# Patient Record
Sex: Female | Born: 1945 | Race: White | Hispanic: No | Marital: Married | Smoking: Never smoker
Health system: Southern US, Community
[De-identification: ages and names within clinical notes are randomized; demographics above are authoritative.]

## PROBLEM LIST (undated history)

## (undated) DIAGNOSIS — I5189 Other ill-defined heart diseases: Secondary | ICD-10-CM

## (undated) DIAGNOSIS — N393 Stress incontinence (female) (male): Secondary | ICD-10-CM

## (undated) DIAGNOSIS — I428 Other cardiomyopathies: Secondary | ICD-10-CM

## (undated) DIAGNOSIS — M1990 Unspecified osteoarthritis, unspecified site: Secondary | ICD-10-CM

## (undated) DIAGNOSIS — E785 Hyperlipidemia, unspecified: Secondary | ICD-10-CM

## (undated) DIAGNOSIS — I059 Rheumatic mitral valve disease, unspecified: Secondary | ICD-10-CM

## (undated) DIAGNOSIS — I341 Nonrheumatic mitral (valve) prolapse: Secondary | ICD-10-CM

## (undated) DIAGNOSIS — R609 Edema, unspecified: Secondary | ICD-10-CM

## (undated) HISTORY — PX: OVARIAN CYST REMOVAL: SHX89

## (undated) HISTORY — DX: Hyperlipidemia, unspecified: E78.5

## (undated) HISTORY — DX: Rheumatic mitral valve disease, unspecified: I05.9

## (undated) HISTORY — PX: ABDOMINAL HYSTERECTOMY: SHX81

## (undated) HISTORY — DX: Edema, unspecified: R60.9

## (undated) HISTORY — DX: Nonrheumatic mitral (valve) prolapse: I34.1

## (undated) HISTORY — DX: Other ill-defined heart diseases: I51.89

## (undated) HISTORY — DX: Other cardiomyopathies: I42.8

## (undated) HISTORY — DX: Unspecified osteoarthritis, unspecified site: M19.90

## (undated) HISTORY — DX: Stress incontinence (female) (male): N39.3

## (undated) HISTORY — PX: CARDIAC CATHETERIZATION: SHX172

---

## 2005-02-18 ENCOUNTER — Ambulatory Visit: Payer: Self-pay | Admitting: Cardiology

## 2005-02-18 ENCOUNTER — Ambulatory Visit: Payer: Self-pay | Admitting: Internal Medicine

## 2005-02-25 ENCOUNTER — Ambulatory Visit: Payer: Self-pay

## 2005-03-17 ENCOUNTER — Ambulatory Visit: Payer: Self-pay | Admitting: Internal Medicine

## 2005-04-15 ENCOUNTER — Ambulatory Visit: Payer: Self-pay | Admitting: Internal Medicine

## 2010-02-25 ENCOUNTER — Encounter: Payer: Self-pay | Admitting: Cardiovascular Disease

## 2012-01-16 ENCOUNTER — Encounter: Payer: Self-pay | Admitting: Cardiovascular Disease

## 2012-01-16 ENCOUNTER — Ambulatory Visit (INDEPENDENT_AMBULATORY_CARE_PROVIDER_SITE_OTHER): Payer: Medicare Other | Admitting: Cardiovascular Disease

## 2012-01-16 VITALS — BP 110/71 | HR 87 | Ht 66.0 in | Wt 192.0 lb

## 2012-01-16 DIAGNOSIS — I429 Cardiomyopathy, unspecified: Secondary | ICD-10-CM | POA: Insufficient documentation

## 2012-01-16 DIAGNOSIS — R002 Palpitations: Secondary | ICD-10-CM | POA: Insufficient documentation

## 2012-01-16 NOTE — Assessment & Plan Note (Signed)
Non-ischemic. Will repeat echo.

## 2012-01-16 NOTE — Assessment & Plan Note (Signed)
Most likely premature beats. I will only arrange a monitor if she has recurrence of palpitations. She will let us know if she has recurrence.

## 2012-01-16 NOTE — Progress Notes (Signed)
History of Present Illness: 66 yo WF with history of palpitations, mitral valve prolapse, mild MR, HLD who is here today to establish cardiology care. She tells me that has had some upper back pain. This occurs at rest and with exertion. No chest pain. She has noticed some pain in her neck as well. This is over her left shoulder and left neck. She was awakened last week with her heart racing and fluttering. This lasted for several minutes. No recurrence. Normal cardiac cath at [facility]. Echo 10/10 with LVEF 50%. Septal hypertrophy, mild. Mild MR. Moderate LAE. Myoview 10/09 with LVEF 42%  Primary Care Physician:   Last Lipid Profile:  Past Medical History  Diagnosis Date  . Arrhythmia     HEART FLUTTER  . Dizziness   . Mitral valve problem     BY HISTORY  . Mitral regurgitation   . 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 Connective tissue disease     unspecified  . Stress incontinence   . Dizziness   . Hyperlipidemia     mixed  . SOB (shortness of breath)     Past Surgical History  Procedure Date  . Cardiac catheterization     1998 [facility] "CAD"    Current Outpatient Prescriptions  Medication Sig Dispense Refill  . aspirin 81 MG tablet Take 81 mg by mouth daily.      . clindamycin (CLEOCIN) 300 MG capsule Take 300 mg by mouth 3 (three) times daily. PRIOR TO DENTAL SURGERY      . diclofenac sodium (VOLTAREN) 1 % GEL As directed      . diphenhydrAMINE (SOMINEX) 25 MG tablet Benadryl when necessary      . LORazepam (ATIVAN) 0.5 MG tablet Take 0.5 mg by mouth every 8 (eight) hours.      . Multiple Vitamin (MULTIVITAMIN) tablet Take 1 tablet by mouth daily.      . NON FORMULARY Nametasone furoate cream 1 %  As needed for dry patches on skin        Allergies  Allergen Reactions  . Atorvastatin   . Carbocaine (Mepivacaine Hcl)     Anaphylaxis- pt stopped breathing  . Epinephrine   . Mepivacaine   . Penicillins   . Pravastatin     Extreme pain in joints  . Procaine   .  Sulfa Antibiotics     History   Social History  . Marital Status: Married    Spouse Name: N/A    Number of Children: 3  . Years of Education: N/A   Occupational History  . contractor    Social History Main Topics  . Smoking status: Never Smoker   . Smokeless tobacco: Not on file  . Alcohol Use: Not on file  . Drug Use: Not on file  . Sexually Active: Not on file   Other Topics Concern  . Not on file   Social History Narrative  . No narrative on file    Family History  Problem Relation Age of Onset  . Coronary artery disease Other   . Obesity Other   . Hypertension Other   . Heart failure Father     Review of Systems:  As stated in the HPI and otherwise negative.   BP 110/71  Pulse 87  Ht 5\' 6"  (1.676 m)  Wt 192 lb (87.091 kg)  BMI 30.99 kg/m2  Physical Examination: General: Well developed, well nourished, NAD HEENT: OP clear, mucus membranes moist SKIN: warm,  dry. No rashes. Neuro: No focal deficits Musculoskeletal: Muscle strength 5/5 all ext Psychiatric: Mood and affect normal Neck: No JVD, no carotid bruits, no thyromegaly, no lymphadenopathy. Lungs:Clear bilaterally, no wheezes, rhonci, crackles Cardiovascular: Regular rate and rhythm. No murmurs, gallops or rubs. Abdomen:Soft. Bowel sounds present. Non-tender.  Extremities: No lower extremity edema. Pulses are 2 + in the bilateral DP/PT.  EKG:  NSR, rate 87 bpm. Non-specific T wave changes.

## 2012-01-16 NOTE — Patient Instructions (Signed)

## 2012-01-22 ENCOUNTER — Telehealth: Payer: Self-pay | Admitting: *Deleted

## 2012-01-22 ENCOUNTER — Ambulatory Visit (HOSPITAL_COMMUNITY): Payer: Medicare Other | Attending: Cardiology

## 2012-01-22 ENCOUNTER — Other Ambulatory Visit: Payer: Self-pay

## 2012-01-22 DIAGNOSIS — I428 Other cardiomyopathies: Secondary | ICD-10-CM | POA: Insufficient documentation

## 2012-01-22 DIAGNOSIS — I059 Rheumatic mitral valve disease, unspecified: Secondary | ICD-10-CM | POA: Insufficient documentation

## 2012-01-22 DIAGNOSIS — I079 Rheumatic tricuspid valve disease, unspecified: Secondary | ICD-10-CM | POA: Insufficient documentation

## 2012-01-22 DIAGNOSIS — I369 Nonrheumatic tricuspid valve disorder, unspecified: Secondary | ICD-10-CM

## 2012-01-22 DIAGNOSIS — I510 Cardiac septal defect, acquired: Secondary | ICD-10-CM | POA: Insufficient documentation

## 2012-01-22 DIAGNOSIS — E785 Hyperlipidemia, unspecified: Secondary | ICD-10-CM | POA: Insufficient documentation

## 2012-01-22 DIAGNOSIS — R0609 Other forms of dyspnea: Secondary | ICD-10-CM | POA: Insufficient documentation

## 2012-01-22 DIAGNOSIS — I429 Cardiomyopathy, unspecified: Secondary | ICD-10-CM

## 2012-01-22 DIAGNOSIS — R0989 Other specified symptoms and signs involving the circulatory and respiratory systems: Secondary | ICD-10-CM | POA: Insufficient documentation

## 2012-01-22 DIAGNOSIS — I519 Heart disease, unspecified: Secondary | ICD-10-CM | POA: Insufficient documentation

## 2012-01-22 DIAGNOSIS — R002 Palpitations: Secondary | ICD-10-CM | POA: Insufficient documentation

## 2012-01-22 NOTE — Telephone Encounter (Signed)
Pt aware of echo results 

## 2015-06-21 ENCOUNTER — Encounter: Payer: Medicare Other | Admitting: Cardiovascular Disease

## 2015-06-28 ENCOUNTER — Encounter: Payer: Medicare Other | Admitting: Cardiovascular Disease

## 2015-09-13 ENCOUNTER — Encounter: Payer: Medicare Other | Admitting: Cardiovascular Disease

## 2016-06-04 ENCOUNTER — Encounter: Payer: Self-pay | Admitting: Cardiovascular Disease

## 2016-06-04 ENCOUNTER — Ambulatory Visit (INDEPENDENT_AMBULATORY_CARE_PROVIDER_SITE_OTHER): Payer: Medicare Other | Admitting: Cardiovascular Disease

## 2016-06-04 VITALS — BP 120/70 | HR 81 | Ht 66.5 in | Wt 207.8 lb

## 2016-06-04 DIAGNOSIS — R0789 Other chest pain: Secondary | ICD-10-CM

## 2016-06-04 DIAGNOSIS — I428 Other cardiomyopathies: Secondary | ICD-10-CM | POA: Diagnosis not present

## 2016-06-04 DIAGNOSIS — E7800 Pure hypercholesterolemia, unspecified: Secondary | ICD-10-CM

## 2016-06-04 NOTE — Progress Notes (Signed)
Chief Complaint  Patient presents with  . Chest Pain    History of Present Illness: 70 yo WF with history of palpitations, mitral valve prolapse, HLD who is here today to re-establish cardiology care. I saw her once in May 2013 as a new patient for evaulation of palpitations and decreased LV systolic function. Normal cardiac cath at [facility]. Echo 10/10 with LVEF 50%. Septal hypertrophy, mild. Mild MR. Moderate LAE. Myoview 10/09 with LVEF 42%. I ordered an echo at her first visit here in 2013. This showed normal LV systolic function with LVEF=60%. No mitral regurgitation. She was told in the past she had mitral valve prolapse. She has not tolerated statins. She has been followed by a cardiologist in [place], [place] and she feels that her legs still hurt because of the statin that he had prescribed. She is not happy with what she was told in that office so she is here today to re-establish in our office.   She tells me today that she has occasional chest pressure. This has been over the last 3 months. This has happened at rest while lying in bed, several times when she is hurrying. This is described as a substernal heaviness. She has had some LE edema. This resolves at night. She has rare palpitations. Overall feeling well.   Primary Care Physician: No PCP Per Patient  Her PCP recently moved.    Past Medical History:  Diagnosis Date  . Hyperlipidemia    mixed  . Mitral valve problem    BY HISTORY  . 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 Stress incontinence     Past Surgical History:  Procedure Laterality Date  . ABDOMINAL HYSTERECTOMY    . CARDIAC CATHETERIZATION     1998 [facility] "CAD"  . OVARIAN CYST REMOVAL      Current Outpatient Prescriptions  Medication Sig Dispense Refill  . aspirin 81 MG tablet Take 81 mg by mouth daily.    . clindamycin (CLEOCIN) 300 MG capsule Take 300 mg by mouth 3 (three) times daily. PRIOR TO DENTAL SURGERY    . diclofenac sodium (VOLTAREN) 1 % GEL  Apply 4 g topically as directed. As directed     . LORazepam (ATIVAN) 0.5 MG tablet Take 0.5 mg by mouth every 8 (eight) hours.    . Multiple Vitamin (MULTIVITAMIN) tablet Take 1 tablet by mouth daily.    . NON FORMULARY Apply 1 application topically 2 (two) times daily as needed. Nametasone furoate cream 1 %  As needed for dry patches on skin      No current facility-administered medications for this visit.     Allergies  Allergen Reactions  . Mepivacaine Anaphylaxis  . Procaine Other (See Comments)    Irregular heartbeat  . Penicillins Other (See Comments)    Weakness, vision problems, dyspnea, sweating  . Sulfa Antibiotics Other (See Comments)    Topical cream applied - made her rash worse where she applied it.  . Atorvastatin   . Carbocaine [Mepivacaine Hcl]     Anaphylaxis- pt stopped breathing  . Epinephrine Other (See Comments)    "extremem rapid heart beat"  . Pravastatin     Extreme pain in joints    Social History   Social History  . Marital status: Married    Spouse name: N/A  . Number of children: 3  . Years of education: N/A   Occupational History  . contractor    Social History Main Topics  .  Smoking status: Never Smoker  . Smokeless tobacco: Not on file  . Alcohol use No  . Drug use: No  . Sexual activity: Not on file   Other Topics Concern  . Not on file   Social History Narrative  . No narrative on file    Family History  Problem Relation Age of Onset  . Coronary artery disease Other   . Obesity Other   . Hypertension Other   . Heart failure Father     Review of Systems:  As stated in the HPI and otherwise negative.   BP 120/70   Pulse 81   Ht 5' 6.5" (1.689 m)   Wt 207 lb 12.8 oz (94.3 kg)   BMI 33.04 kg/m   Physical Examination: General: Well developed, well nourished, NAD  HEENT: OP clear, mucus membranes moist  SKIN: warm, dry. No rashes. Neuro: No focal deficits  Musculoskeletal: Muscle strength 5/5 all ext  Psychiatric:  Mood and affect normal  Neck: No JVD, no carotid bruits, no thyromegaly, no lymphadenopathy.  Lungs:Clear bilaterally, no wheezes, rhonci, crackles Cardiovascular: Regular rate and rhythm. No murmurs, gallops or rubs. Abdomen:Soft. Bowel sounds present. Non-tender.  Extremities: No lower extremity edema. Pulses are 2 + in the bilateral DP/PT.  Echo May 2013: Left ventricle: The cavity size was normal. Wall thickness was increased increased in a pattern of mild to moderate LVH. The estimated ejection fraction was 60%. Wall motion was normal; there were no regional wall motion abnormalities. Doppler parameters are consistent with abnormal left ventricular relaxation (grade 1 diastolic dysfunction). - Mitral valve: Flat closure of the mitral valve. No significant regurgitation. - Right ventricle: The cavity size was normal. Systolic function was normal. 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 - Tricuspid valve: Mild regurgitation. - Pulmonary arteries: PA peak pressure: 25mm Hg (S).  EKG:  EKG is ordered today. The ekg ordered today demonstrates NSR,r ate 81 bpm.   Recent Labs: No results found for requested labs within last 8760 hours.   Lipid Panel No results found for: CHOL, TRIG, HDL, CHOLHDL, VLDL, LDLCALC, LDLDIRECT   Wt Readings from Last 3 Encounters:  06/04/16 207 lb 12.8 oz (94.3 kg)  01/16/12 192 lb (87.1 kg)     Other studies Reviewed: Additional studies/ records that were reviewed today include: . Review of the above records demonstrates:   Assessment and Plan:   1. Chest pain: She has exertional chest pressure. No prior documented CAD. She had [facility] coronary arteries by [facility]. Will arrange exercise nuclear stress test to exclude ischemia. Echo to assess LVEF.   2. Non-ischemic cardiomyopathy: Last LVEF in 2013 was normal. Given her LE edema, will arrange echo to assess LVEF, exclude valvular disease.   3.  Hyperlipidemia: She is statin intolerant. Will check lipids. Consider PCSK9 inh therapy.   4. Lower extremity edema: this seems to be dependent edema. Echo as above.    Current medicines are reviewed at length with the patient today.  The patient does not have concerns regarding medicines.  The following changes have been made:  no change  Labs/ tests ordered today include:   Orders Placed This Encounter  Procedures  . Lipid Profile  . Myocardial Perfusion Imaging  . EKG 12-Lead  . ECHOCARDIOGRAM COMPLETE   Disposition:   FU with me in 6 weeks  Signed, Verne Carrowhristopher Asiya Cutbirth, MD 06/04/2016 4:41 PM    [facility] [address], [place], [place]  [address] Phone: [phone]; Fax: [phone]

## 2016-06-04 NOTE — Patient Instructions (Addendum)
Medication Instructions:  Your physician recommends that you continue on your current medications as directed. Please refer to the Current Medication list given to you today.   Labwork:  Your physician recommends that you return for lab work on day of tests.  This will be fasting   Testing/Procedures: Your physician has requested that you have an exercise stress myoview. For further information please visit [email or web address] Please follow instruction sheet, as given.  Your physician has requested that you have an echocardiogram. Echocardiography is a painless test that uses sound waves to create images of your heart. It provides your doctor with information about the size and shape of your heart and how well your heart's chambers and valves are working. This procedure takes approximately one hour. There are no restrictions for this procedure.   Follow-Up: Your physician recommends that you schedule a follow-up appointment in: about 6-8 weeks. --Scheduled for November 3,2017 at 9:15    Any Other Special Instructions Will Be Listed Below (If Applicable).     If you need a refill on your cardiac medications before your next appointment, please call your pharmacy.

## 2016-06-19 ENCOUNTER — Telehealth (HOSPITAL_COMMUNITY): Payer: Self-pay | Admitting: *Deleted

## 2016-06-19 NOTE — Telephone Encounter (Signed)
Patient given detailed instructions per Myocardial Perfusion Study Information Sheet for the test on 06/24/16. Patient notified to arrive 15 minutes early and that it is imperative to arrive on time for appointment to keep from having the test rescheduled.  If you need to cancel or reschedule your appointment, please call the office within 24 hours of your appointment. Failure to do so may result in a cancellation of your appointment, and a $50 no show fee. Patient verbalized understanding. Jalil Lorusso Jacqueline    

## 2016-06-23 ENCOUNTER — Other Ambulatory Visit: Payer: Self-pay

## 2016-06-23 ENCOUNTER — Ambulatory Visit (HOSPITAL_COMMUNITY): Payer: Medicare Other | Attending: Cardiovascular Disease

## 2016-06-23 DIAGNOSIS — I428 Other cardiomyopathies: Secondary | ICD-10-CM

## 2016-06-23 DIAGNOSIS — E785 Hyperlipidemia, unspecified: Secondary | ICD-10-CM | POA: Insufficient documentation

## 2016-06-23 DIAGNOSIS — M7989 Other specified soft tissue disorders: Secondary | ICD-10-CM | POA: Insufficient documentation

## 2016-06-23 DIAGNOSIS — R0789 Other chest pain: Secondary | ICD-10-CM | POA: Diagnosis not present

## 2016-06-23 DIAGNOSIS — E669 Obesity, unspecified: Secondary | ICD-10-CM | POA: Insufficient documentation

## 2016-06-23 DIAGNOSIS — I071 Rheumatic tricuspid insufficiency: Secondary | ICD-10-CM | POA: Insufficient documentation

## 2016-06-23 DIAGNOSIS — I340 Nonrheumatic mitral (valve) insufficiency: Secondary | ICD-10-CM | POA: Insufficient documentation

## 2016-06-23 DIAGNOSIS — I429 Cardiomyopathy, unspecified: Secondary | ICD-10-CM | POA: Diagnosis not present

## 2016-06-23 LAB — ECHOCARDIOGRAM COMPLETE
CHL CUP DOP CALC LVOT VTI: 22.1 cm
CHL CUP RV SYS PRESS: 13 mmHg
E decel time: 285 msec
EERAT: 9.47
FS: 23 % — AB (ref 28–44)
IV/PV OW: 0.78
LA ID, A-P, ES: 38 mm
LA diam end sys: 38 mm
LA diam index: 1.78 cm/m2
LA vol A4C: 25.9 ml
LA vol index: 14.5 mL/m2
LAVOL: 31 mL
LV E/e' medial: 9.47
LV TDI E'LATERAL: 6.2
LV e' LATERAL: 6.2 cm/s
LVEEAVG: 9.47
LVOT area: 3.8 cm2
LVOT diameter: 22 mm
LVOTPV: 99.8 cm/s
LVOTSV: 84 mL
MV Dec: 285
MV pk A vel: 59.2 m/s
MVPKEVEL: 58.7 m/s
PW: 10.2 mm — AB (ref 0.6–1.1)
Reg peak vel: 157 cm/s
TDI e' medial: 4.9
TRMAXVEL: 157 cm/s

## 2016-06-24 ENCOUNTER — Other Ambulatory Visit (HOSPITAL_COMMUNITY): Payer: Medicare Other

## 2016-06-24 ENCOUNTER — Ambulatory Visit: Payer: Medicare Other | Admitting: *Deleted

## 2016-06-24 ENCOUNTER — Ambulatory Visit (HOSPITAL_COMMUNITY): Payer: Medicare Other | Attending: Cardiology

## 2016-06-24 VITALS — Ht 66.5 in | Wt 207.0 lb

## 2016-06-24 DIAGNOSIS — R0789 Other chest pain: Secondary | ICD-10-CM

## 2016-06-24 DIAGNOSIS — R079 Chest pain, unspecified: Secondary | ICD-10-CM

## 2016-06-24 DIAGNOSIS — I428 Other cardiomyopathies: Secondary | ICD-10-CM

## 2016-06-24 LAB — MYOCARDIAL PERFUSION IMAGING
CHL CUP NUCLEAR SDS: 3
CHL CUP RESTING HR STRESS: 59 {beats}/min
CSEPPHR: 139 {beats}/min
Estimated workload: 7 METS
Exercise duration (min): 5 min
Exercise duration (sec): 0 s
LHR: 0.45
LVDIAVOL: 96 mL (ref 46–106)
LVSYSVOL: 33 mL
MPHR: 150 {beats}/min
Percent HR: 92 %
RPE: 19
SRS: 8
SSS: 11
TID: 0.93

## 2016-06-24 LAB — LIPID PANEL
Cholesterol: 241 mg/dL — ABNORMAL HIGH (ref 125–200)
HDL: 54 mg/dL (ref >=46)
LDL CALC: 169 mg/dL — AB (ref <130)
TRIGLYCERIDES: 91 mg/dL (ref <150)
Total CHOL/HDL Ratio: 4.5 Ratio (ref <=5.0)
VLDL: 18 mg/dL (ref <30)

## 2016-06-24 MED ORDER — TECHNETIUM TC 99M TETROFOSMIN IV KIT
32.1000 | PACK | Freq: Once | INTRAVENOUS | Status: AC | PRN
  Administered 2016-06-24: 32.1 via INTRAVENOUS
  Filled 2016-06-24: qty 33

## 2016-06-24 MED ORDER — TECHNETIUM TC 99M TETROFOSMIN IV KIT
10.6000 | PACK | Freq: Once | INTRAVENOUS | Status: AC | PRN
  Administered 2016-06-24: 10.6 via INTRAVENOUS
  Filled 2016-06-24: qty 11

## 2016-06-26 ENCOUNTER — Other Ambulatory Visit: Payer: Self-pay | Admitting: *Deleted

## 2016-06-26 DIAGNOSIS — E7849 Other hyperlipidemia: Secondary | ICD-10-CM

## 2016-06-26 MED ORDER — EZETIMIBE 10 MG PO TABS: 10.0000 mg | ORAL_TABLET | Freq: Every day | ORAL | 11 refills | Status: DC

## 2016-07-04 ENCOUNTER — Ambulatory Visit (INDEPENDENT_AMBULATORY_CARE_PROVIDER_SITE_OTHER): Payer: Medicare Other | Admitting: Cardiovascular Disease

## 2016-07-04 ENCOUNTER — Encounter: Payer: Self-pay | Admitting: Cardiovascular Disease

## 2016-07-04 VITALS — BP 110/70 | HR 93 | Ht 66.5 in | Wt 207.6 lb

## 2016-07-04 DIAGNOSIS — E7800 Pure hypercholesterolemia, unspecified: Secondary | ICD-10-CM | POA: Diagnosis not present

## 2016-07-04 DIAGNOSIS — R0789 Other chest pain: Secondary | ICD-10-CM | POA: Diagnosis not present

## 2016-07-04 NOTE — Progress Notes (Signed)
Chief Complaint  Patient presents with  . Follow-up    Nonischemic Cardiomyopathy    History of Present Illness: [age] yo WF with history of palpitations, mitral valve prolapse, HLD who is here today for follow up. I saw her once in May 2013 as a new patient for evaluation of palpitations and decreased LV systolic function. [facility]. Echo 2010 with LVEF 50%. Septal hypertrophy, mild. Mild MR. Moderate LAE. Myoview 2009 with LVEF 42%. I ordered an echo at her first visit here in 2013. This showed normal LV systolic function with LVEF=60%. No mitral regurgitation. She was told in the past she had mitral valve prolapse. She has not tolerated statins. She has been followed by [place] cardiologist in [place] and she feels that her legs still hurt because of the statin that he had prescribed. She was seen in October 2017 in our office as she was not happy with what she was told in that office. She described occasional chest pressure while at rest, rare palpitations and some LE edema.  Echo 06/23/16 with normal LV systolic function, grade 2 diastolic dysfunction, no significant valve disase. Nuclear stress test with no ischemia.   She is here today for follow up.  No chest pain or dyspnea. Rare LE edema after driving.   Primary Care Physician: Pcp Not In System  Her PCP recently moved.    Past Medical History:  Diagnosis Date  . Hyperlipidemia    mixed  . Mitral valve problem    BY HISTORY  . 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 Stress incontinence     Past Surgical History:  Procedure Laterality Date  . ABDOMINAL HYSTERECTOMY    . CARDIAC CATHETERIZATION     1998 [facility] "CAD"  . OVARIAN CYST REMOVAL      Current Outpatient Prescriptions  Medication Sig Dispense Refill  . aspirin 81 MG tablet Take 81 mg by mouth daily.    . clindamycin (CLEOCIN) 300 MG capsule Take 300 mg by mouth 3 (three) times daily. PRIOR TO DENTAL SURGERY    . diclofenac sodium (VOLTAREN) 1  % GEL Apply 4 g topically as directed. As directed     . ezetimibe (ZETIA) 10 MG tablet Take 1 tablet (10 mg total) by mouth daily. 30 tablet 11  . Multiple Vitamin (MULTIVITAMIN) tablet Take 1 tablet by mouth daily.    . NON FORMULARY Apply 1 application topically 2 (two) times daily as needed. Nametasone furoate cream 1 %  As needed for dry patches on skin      No current facility-administered medications for this visit.     Allergies  Allergen Reactions  . Mepivacaine Anaphylaxis  . Procaine Other (See Comments)    Irregular heartbeat  . Penicillins Other (See Comments)    Weakness, vision problems, dyspnea, sweating  . Sulfa Antibiotics Other (See Comments)    Topical cream applied - made her rash worse where she applied it.  . Atorvastatin   . Carbocaine [Mepivacaine Hcl]     Anaphylaxis- pt stopped breathing  . Epinephrine Other (See Comments)    "extremem rapid heart beat"  . Pravastatin     Extreme pain in joints    Social History   Social History  . Marital status: Married    Spouse name: N/A  . Number of children: 3  . Years of education: N/A   Occupational History  . contractor    Social History Main  Topics  . Smoking status: Never Smoker  . Smokeless tobacco: Not on file  . Alcohol use No  . Drug use: No  . Sexual activity: Not on file   Other Topics Concern  . Not on file   Social History Narrative  . No narrative on file    Family History  Problem Relation Age of Onset  . Coronary artery disease Other   . Obesity Other   . Hypertension Other   . Heart failure Father     Review of Systems:  As stated in the HPI and otherwise negative.   BP 110/70   Pulse 93   Ht 5' 6.5" (1.689 m)   Wt 207 lb 9.6 oz (94.2 kg)   BMI 33.01 kg/m   Physical Examination: General: Well developed, well nourished, NAD  HEENT: OP clear, mucus membranes moist  SKIN: warm, dry. No rashes. Neuro: No focal deficits  Musculoskeletal: Muscle strength 5/5 all ext    Psychiatric: Mood and affect normal  Neck: No JVD, no carotid bruits, no thyromegaly, no lymphadenopathy.  Lungs:Clear bilaterally, no wheezes, rhonci, crackles Cardiovascular: Regular rate and rhythm. No murmurs, gallops or rubs. Abdomen:Soft. Bowel sounds present. Non-tender.  Extremities: No lower extremity edema. Pulses are 2 + in the bilateral DP/PT.  Echo 06/23/16: Left ventricle: The cavity size was normal. Systolic function was   normal. The estimated ejection fraction was in the range of 50%   to 55%. Wall motion was normal; there were no regional wall   motion abnormalities. Features are consistent with a pseudonormal   left ventricular filling pattern, with concomitant abnormal   relaxation and increased filling pressure (grade 2 diastolic   dysfunction). Doppler parameters are consistent with   indetermiante ventricular filling pressure. - Aortic valve: Transvalvular velocity was within the normal range.   There was no stenosis. There was no regurgitation. - Mitral valve: Transvalvular velocity was within the normal range.   There was no evidence for stenosis. There was trivial   regurgitation. - Left atrium: The atrium was mildly dilated. - Right ventricle: The cavity size was normal. Wall thickness was   normal. Systolic function was normal. - Atrial septum: No defect or patent foramen ovale was identified   by color flow Doppler. - Tricuspid valve: There was trivial regurgitation. - Pulmonary arteries: Systolic pressure was within the normal   range. PA peak pressure: 13 mm Hg (S).  Nuclear stress test 06/24/16:  Nuclear stress EF: 66%.  Blood pressure demonstrated a normal response to exercise.  There was no ST segment deviation noted during stress.  Defect 1: There is a small defect of severe severity present in the apex location.  This is a low risk study.  The left ventricular ejection fraction is hyperdynamic (>65%).   Low risk stress nuclear study  with chest pain but images show apical thinning and no ischemia; EF 66 with normal wall motion. .  EKG:  EKG is not  ordered today. The ekg ordered today demonstrates  Recent Labs: No results found for requested labs within last 8760 hours.   Lipid Panel    Component Value Date/Time   CHOL 241 (H) 06/24/2016 0911   TRIG 91 06/24/2016 0911   HDL 54 06/24/2016 0911   CHOLHDL 4.5 06/24/2016 0911   VLDL 18 06/24/2016 0911   LDLCALC 169 (H) 06/24/2016 0911     Wt Readings from Last 3 Encounters:  07/04/16 207 lb 9.6 oz (94.2 kg)  06/24/16 207 lb (93.9 kg)  06/04/16 207 lb 12.8 oz (94.3 kg)     Other studies Reviewed: Additional studies/ records that were reviewed today include: . Review of the above records demonstrates:   Assessment and Plan:   1. Chest pain: She has rare resting and exertional chest pressure but her stress test October 2017 shows no ischemia. Echo with normal LV function. No prior documented CAD. She had normal coronary arteries by cath in 1998 at [facility]. No further ischemic workup.  2. Non-ischemic cardiomyopathy: Echo October 2017 with normal LVEF.    3. Hyperlipidemia: She is statin intolerant. LDL is 169. We have started Zetia. Repeat lipids in 12 weeks. If LDL not at goal, consider PCSK9 inh therapy.   4. Lower extremity edema: this seems to be dependent edema. Only occurs several times per month after long trips. Echo with normal LV systolic function but she does have diastolic dysfunction. She does not wish to try Lasix prn.    Current medicines are reviewed at length with the patient today.  The patient does not have concerns regarding medicines.  The following changes have been made:  no change  Labs/ tests ordered today include:   No orders of the defined types were placed in this encounter.  Disposition:   FU with me in 6 months  Signed, Verne Carrow, MD 07/04/2016 9:55 AM    [facility] [address] [place],  [place], [place]  [address] Phone: [phone]; Fax: [phone]

## 2016-07-04 NOTE — Patient Instructions (Signed)
Medication Instructions:  Your physician recommends that you continue on your current medications as directed. Please refer to the Current Medication list given to you today.   Labwork:  Your physician recommends that you return for lab work in: on January 16,2018.  This is fasting. The lab opens at 7:30AM    Testing/Procedures: none  Follow-Up:   Your physician recommends that you schedule a follow-up appointment in: 6 months. Please call us in about 3 months to schedule.    Any Other Special Instructions Will Be Listed Below (If Applicable).     If you need a refill on your cardiac medications before your next appointment, please call your pharmacy.

## 2016-07-22 ENCOUNTER — Other Ambulatory Visit: Payer: Self-pay | Admitting: Cardiovascular Disease

## 2016-07-22 DIAGNOSIS — E7849 Other hyperlipidemia: Secondary | ICD-10-CM

## 2016-07-22 MED ORDER — EZETIMIBE 10 MG PO TABS: 10.0000 mg | ORAL_TABLET | Freq: Every day | ORAL | 3 refills | Status: DC

## 2016-09-16 ENCOUNTER — Other Ambulatory Visit: Payer: Medicare Other | Admitting: *Deleted

## 2016-09-16 DIAGNOSIS — E7849 Other hyperlipidemia: Secondary | ICD-10-CM

## 2016-09-16 DIAGNOSIS — I429 Cardiomyopathy, unspecified: Secondary | ICD-10-CM

## 2016-09-16 DIAGNOSIS — R002 Palpitations: Secondary | ICD-10-CM

## 2016-09-22 LAB — LIPID PANEL W/O CHOL/HDL RATIO
CHOLESTEROL TOTAL: 273 mg/dL — AB (ref 100–199)
HDL: 67 mg/dL (ref >39)
LDL CALC: 184 mg/dL — AB (ref 0–99)
Triglycerides: 112 mg/dL (ref 0–149)
VLDL CHOLESTEROL CAL: 22 mg/dL (ref 5–40)

## 2016-09-22 LAB — AMBIG ABBREV LP DEFAULT

## 2016-09-22 LAB — SPECIMEN STATUS REPORT

## 2016-09-23 ENCOUNTER — Telehealth: Payer: Self-pay

## 2016-09-23 ENCOUNTER — Telehealth: Payer: Self-pay | Admitting: Pharmacist

## 2016-09-23 MED ORDER — ROSUVASTATIN CALCIUM 5 MG PO TABS: 5.0000 mg | ORAL_TABLET | Freq: Every day | ORAL | 11 refills | Status: DC

## 2016-09-23 MED ORDER — ROSUVASTATIN CALCIUM 5 MG PO TABS: ORAL_TABLET | ORAL | 11 refills | Status: DC

## 2016-09-23 NOTE — Telephone Encounter (Signed)
-----   Message from Kathleene Hazel, MD sent at 09/23/2016  7:42 AM EST ----- She was started on Zetia in November. She is statin intolerant. Her LDL is not improved on Zetia. Can we let her know the results and see if she is willing to be referred to lipid clinic to discuss PCSK9 inh therapy? Thanks, chris

## 2016-09-23 NOTE — Telephone Encounter (Signed)
Patient is in agreement to have referral to lipid clinic for PCSK9 inhibitor therapy.

## 2016-09-23 NOTE — Telephone Encounter (Addendum)
Spoke with pt regarding lipid panel - she was referred for [facility] therapy. Since patient has no history of ASCVD or FH, PCSK9i will not be covered by her insurance. A few of our lipid trials include primary prevention patients, but pt would not qualify for those either since she has very minimal risk factors (no HTN, DM, smoking, or alcohol abuse). Does have some family history - her father passed from heart disease. Discussed that options are limited for primary prevention if pt is intolerant to statins and Zetia. She is willing to try low dose Crestor 5mg  3 days a week. Rx has been sent in and will call in 1 month to f/u with tolerability.

## 2016-10-21 ENCOUNTER — Other Ambulatory Visit: Payer: Self-pay

## 2016-10-21 MED ORDER — ROSUVASTATIN CALCIUM 5 MG PO TABS: ORAL_TABLET | ORAL | 2 refills | Status: DC

## 2016-11-14 NOTE — Telephone Encounter (Signed)
Called pt to f/u with Crestor tolerability. She reports experiencing myalgias on Crestor 5mg  3x per week but has been tolerating 2x per week dosing. She will see if her PCP can draw a f/u lipid panel in the next 3-4 weeks and send results to [place].

## 2016-12-23 NOTE — Telephone Encounter (Signed)
Pt called to let us know that her PCP checked her cholesterol yesterday. She has increased her Crestor dose back to 5mg  3 days per week and is tolerating it ok. Provided pt with our fax number and she will have her PCP fax lab results to [place].

## 2017-02-05 ENCOUNTER — Telehealth: Payer: Self-pay | Admitting: Pharmacist

## 2017-02-05 NOTE — Telephone Encounter (Signed)
Received lipid panel drawn in [place] on 12/22/16:  TC 209, TG 124, HDL 52, LDL 132.  LDL much improved from baseline of 184 down to 132 on Crestor 5mg  2x per week. LDL very close to goal < 130 for primary prevention with minimal risk factors.  Unable to leave message for pt, will attempt to call back later and advise pt to continue current therapy.

## 2017-02-06 NOTE — Telephone Encounter (Signed)
Spoke with pt regarding results - she states she is tolerating Crestor 5mg  2x per week well, but experienced leg cramps on 3x per week dosing. Advised pt that LDL is now at goal and to continue therapy, no further questions.

## 2017-03-17 ENCOUNTER — Encounter: Payer: Self-pay | Admitting: *Deleted

## 2017-03-31 NOTE — Progress Notes (Signed)
Chief Complaint  Patient presents with  . Follow-up    History of Present Illness: 71 yo WF with history of palpitations, mitral valve prolapse, HLD who is here today for follow up. I saw her once in May 2013 as a new patient for evaluation of palpitations and decreased LV systolic function. Normal cardiac cath at [facility]. Echo 2010 with LVEF 50%. Septal hypertrophy, mild. Mild MR. Moderate LAE. Myoview 2009 with LVEF 42%. I ordered an echo at her first visit here in 2013. This showed normal LV systolic function with LVEF=60%. No mitral regurgitation. She was told in the past she had mitral valve prolapse. She has not tolerated statins. She has been followed by a cardiologist in [place], [place] and she feels that her legs still hurt because of the statin that he had prescribed. She was seen in October 2017 in our office as she was not happy with what she was told in that office. She described occasional chest pressure while at rest, rare palpitations and some LE edema.  Echo 06/23/16 with normal LV systolic function, grade 2 diastolic dysfunction, no significant valve disease. Nuclear stress test October 2017 with no ischemia.   She is here today for follow up. The patient denies any chest pain, dyspnea, palpitations, lower extremity edema, orthopnea, PND, dizziness, near syncope or syncope. She walks every day. She lives at [facility].   Primary Care Physician: System, Pcp Not In   Past Medical History:  Diagnosis Date  . Hyperlipidemia    mixed  . Mitral valve problem    BY HISTORY  . 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 Stress incontinence     Past Surgical History:  Procedure Laterality Date  . ABDOMINAL HYSTERECTOMY    . CARDIAC CATHETERIZATION     1998 [facility] "CAD"  . OVARIAN CYST REMOVAL      Current Outpatient Prescriptions  Medication Sig Dispense Refill  . aspirin 81 MG tablet Take 81 mg by mouth daily.    . clindamycin (CLEOCIN) 300 MG capsule Take 300 mg by  mouth 3 (three) times daily. PRIOR TO DENTAL SURGERY    . Coenzyme Q10 (CO Q 10) 100 MG CAPS Take 100 mg by mouth daily.    . diclofenac sodium (VOLTAREN) 1 % GEL Apply 4 g topically as directed. As directed     . magnesium oxide (MAG-OX) 400 MG tablet Take 400 mg by mouth at bedtime. Take 2 tablets daily at bedtime.    . Misc Natural Products (OSTEO BI-FLEX TRIPLE STRENGTH PO) Take 2 tablets by mouth daily as needed (muscle pain).     . Multiple Vitamin (MULTIVITAMIN) tablet Take 1 tablet by mouth daily.    . Multiple Vitamins-Minerals (CENTRUM ADULTS) TABS Take 1 tablet by mouth daily.    . NON FORMULARY Apply 1 application topically 2 (two) times daily as needed. Nametasone furoate cream 1 %  As needed for dry patches on skin     . rosuvastatin (CRESTOR) 5 MG tablet Take 5 mg by mouth 2 (two) times a week.     No current facility-administered medications for this visit.     Allergies  Allergen Reactions  . Mepivacaine Anaphylaxis  . Procaine Other (See Comments)    Irregular heartbeat  . Penicillins Other (See Comments)    Weakness, vision problems, dyspnea, sweating  . Sulfa Antibiotics Other (See Comments)    Topical cream applied - made her rash worse where she applied  it.  . Atorvastatin   . Carbocaine [Mepivacaine Hcl]     Anaphylaxis- pt stopped breathing  . Epinephrine Other (See Comments)    "extremem rapid heart beat"  . Pravastatin     Extreme pain in joints    Social History   Social History  . Marital status: Married    Spouse name: N/A  . Number of children: 3  . Years of education: N/A   Occupational History  . contractor    Social History Main Topics  . Smoking status: Never Smoker  . Smokeless tobacco: Never Used  . Alcohol use No  . Drug use: No  . Sexual activity: Not on file   Other Topics Concern  . Not on file   Social History Narrative  . No narrative on file    Family History  Problem Relation Age of Onset  . Coronary artery disease  Other   . Obesity Other   . Hypertension Other   . Heart failure Father     Review of Systems:  As stated in the HPI and otherwise negative.   BP 110/70   Pulse 80   Ht 5' 6.5" (1.689 m)   Wt 209 lb 3.2 oz (94.9 kg)   SpO2 95%   BMI 33.26 kg/m   Physical Examination:  General: Well developed, well nourished, NAD  HEENT: OP clear, mucus membranes moist  SKIN: warm, dry. No rashes. Neuro: No focal deficits  Musculoskeletal: Muscle strength 5/5 all ext  Psychiatric: Mood and affect normal  Neck: No JVD, no carotid bruits, no thyromegaly, no lymphadenopathy.  Lungs:Clear bilaterally, no wheezes, rhonci, crackles Cardiovascular: Regular rate and rhythm. No murmurs, gallops or rubs. Abdomen:Soft. Bowel sounds present. Non-tender.  Extremities: No lower extremity edema. Pulses are 2 + in the bilateral DP/PT.  Echo 06/23/16: Left ventricle: The cavity size was normal. Systolic function was   normal. The estimated ejection fraction was in the range of 50%   to 55%. Wall motion was normal; there were no regional wall   motion abnormalities. Features are consistent with a pseudonormal   left ventricular filling pattern, with concomitant abnormal   relaxation and increased filling pressure (grade 2 diastolic   dysfunction). Doppler parameters are consistent with   indetermiante ventricular filling pressure. - Aortic valve: Transvalvular velocity was within the normal range.   There was no stenosis. There was no regurgitation. - Mitral valve: Transvalvular velocity was within the normal range.   There was no evidence for stenosis. There was trivial   regurgitation. - Left atrium: The atrium was mildly dilated. - Right ventricle: The cavity size was normal. Wall thickness was   normal. Systolic function was normal. - Atrial septum: No defect or patent foramen ovale was identified   by color flow Doppler. - Tricuspid valve: There was trivial regurgitation. - Pulmonary arteries:  Systolic pressure was within the normal   range. PA peak pressure: 13 mm Hg (S).  Nuclear stress test 06/24/16:  Nuclear stress EF: 66%.  Blood pressure demonstrated a normal response to exercise.  There was no ST segment deviation noted during stress.  Defect 1: There is a small defect of severe severity present in the apex location.  This is a low risk study.  The left ventricular ejection fraction is hyperdynamic (>65%).   Low risk stress nuclear study with chest pain but images show apical thinning and no ischemia; EF 66 with normal wall motion. .  EKG:  EKG is ordered today. The ekg  ordered today demonstrates NSR, rate 79 bpm. Non-specific ST and T wave abn. Unchanged.   Recent Labs: No results found for requested labs within last 8760 hours.   Lipid Panel    Component Value Date/Time   CHOL 273 (H) 09/16/2016 0927   TRIG 112 09/16/2016 0927   HDL 67 09/16/2016 0927   CHOLHDL 4.5 06/24/2016 0911   VLDL 18 06/24/2016 0911   LDLCALC 184 (H) 09/16/2016 0927     Wt Readings from Last 3 Encounters:  04/01/17 209 lb 3.2 oz (94.9 kg)  07/04/16 207 lb 9.6 oz (94.2 kg)  06/24/16 207 lb (93.9 kg)     Other studies Reviewed: Additional studies/ records that were reviewed today include: . Review of the above records demonstrates:   Assessment and Plan:   1. Chest pain: Normal stress test in 2017 with normal LV function.   2. Non-ischemic cardiomyopathy: Normal LV function by echo October 2017. Lower EF reported in the past.   3. Hyperlipidemia: Statin intolerant in the past but tolerating low dose Crestor. LDL near goal of 130.    4. Lower extremity edema: Dependent edema. Has not wanted to try Lasix in the past. This is mild.    Current medicines are reviewed at length with the patient today.  The patient does not have concerns regarding medicines.  The following changes have been made:  no change  Labs/ tests ordered today include:   Orders Placed This  Encounter  Procedures  . EKG 12-Lead   Disposition:   FU with me in 12 months  Signed, Verne Carrow, MD 04/01/2017 9:19 AM    [facility] [address] [place], [place], [place]  [address] Phone: [phone]; Fax: [phone]

## 2017-04-01 ENCOUNTER — Encounter (INDEPENDENT_AMBULATORY_CARE_PROVIDER_SITE_OTHER): Payer: Self-pay

## 2017-04-01 ENCOUNTER — Ambulatory Visit (INDEPENDENT_AMBULATORY_CARE_PROVIDER_SITE_OTHER): Payer: Medicare Other | Admitting: Cardiovascular Disease

## 2017-04-01 ENCOUNTER — Encounter: Payer: Self-pay | Admitting: Cardiovascular Disease

## 2017-04-01 VITALS — BP 110/70 | HR 80 | Ht 66.5 in | Wt 209.2 lb

## 2017-04-01 DIAGNOSIS — I428 Other cardiomyopathies: Secondary | ICD-10-CM

## 2017-04-01 DIAGNOSIS — E7800 Pure hypercholesterolemia, unspecified: Secondary | ICD-10-CM

## 2017-04-01 NOTE — Patient Instructions (Signed)
Medication Instructions:  Your physician recommends that you continue on your current medications as directed. Please refer to the Current Medication list given to you today.   Labwork: none  Testing/Procedures: None   Follow-Up: Your physician recommends that you schedule a follow-up appointment in: 12 months. Please call our office in about 9 months to schedule this appointment    Any Other Special Instructions Will Be Listed Below (If Applicable).     If you need a refill on your cardiac medications before your next appointment, please call your pharmacy.   

## 2017-08-13 ENCOUNTER — Other Ambulatory Visit: Payer: Self-pay | Admitting: Cardiovascular Disease

## 2017-11-06 ENCOUNTER — Telehealth: Payer: Self-pay | Admitting: Pharmacist

## 2017-11-06 DIAGNOSIS — E782 Mixed hyperlipidemia: Secondary | ICD-10-CM

## 2017-11-06 NOTE — Telephone Encounter (Signed)
Pt called clinic stating she has been off her Crestor since January due to illness. She is worried about her cholesterol and wants to check it again. Advised pt that her LDL would go back up to baseline in the 180s and we would advise her to restart her previous Crestor 5mg  twice weekly dose which lowered LDL to 132 close to goal < 130 for primary prevention. Pt states she still wants her cholesterol rechecked before starting her Crestor again. Scheduled lipids for Monday.

## 2017-11-09 ENCOUNTER — Other Ambulatory Visit: Payer: Medicare Other | Admitting: *Deleted

## 2017-11-09 DIAGNOSIS — E782 Mixed hyperlipidemia: Secondary | ICD-10-CM

## 2017-11-10 ENCOUNTER — Other Ambulatory Visit: Payer: Self-pay | Admitting: Pharmacist

## 2017-11-10 DIAGNOSIS — E782 Mixed hyperlipidemia: Secondary | ICD-10-CM

## 2017-11-10 LAB — LIPID PANEL
CHOL/HDL RATIO: 5.6 ratio — AB (ref 0.0–4.4)
Cholesterol, Total: 240 mg/dL — ABNORMAL HIGH (ref 100–199)
HDL: 43 mg/dL (ref >39)
LDL Calculated: 167 mg/dL — ABNORMAL HIGH (ref 0–99)
Triglycerides: 148 mg/dL (ref 0–149)
VLDL Cholesterol Cal: 30 mg/dL (ref 5–40)

## 2017-11-20 ENCOUNTER — Other Ambulatory Visit: Payer: Self-pay | Admitting: Dermatology

## 2017-11-20 ENCOUNTER — Ambulatory Visit
Admission: RE | Admit: 2017-11-20 | Discharge: 2017-11-20 | Disposition: A | Discharge status: Home / Self Care | Payer: Medicare Other | Source: Ambulatory Visit | Attending: Dermatology | Admitting: Dermatology

## 2017-11-20 DIAGNOSIS — L299 Pruritus, unspecified: Secondary | ICD-10-CM

## 2018-02-10 ENCOUNTER — Other Ambulatory Visit: Payer: Medicare Other | Admitting: *Deleted

## 2018-02-10 DIAGNOSIS — E782 Mixed hyperlipidemia: Secondary | ICD-10-CM

## 2018-02-10 LAB — LIPID PANEL
CHOL/HDL RATIO: 4 ratio (ref 0.0–4.4)
Cholesterol, Total: 241 mg/dL — ABNORMAL HIGH (ref 100–199)
HDL: 61 mg/dL (ref >39)
LDL CALC: 155 mg/dL — AB (ref 0–99)
Triglycerides: 123 mg/dL (ref 0–149)
VLDL Cholesterol Cal: 25 mg/dL (ref 5–40)

## 2018-02-11 ENCOUNTER — Telehealth: Payer: Self-pay | Admitting: Pharmacist

## 2018-02-11 DIAGNOSIS — E782 Mixed hyperlipidemia: Secondary | ICD-10-CM

## 2018-02-11 MED ORDER — PRAVASTATIN SODIUM 20 MG PO TABS: 20.0000 mg | ORAL_TABLET | Freq: Every evening | ORAL | 11 refills | Status: DC

## 2018-02-11 NOTE — Telephone Encounter (Signed)
Spoke with pt regarding lipid panel results. She reports she developed myalgias when she resumed rosuvastatin 5mg  once weekly (had previously tolerated this dose twice weekly). She has lost about 20 lbs in the past 6 months or so. She is willing to retry pravastatin at a lower dose of 10-20mg  as many days a week as she can tolerate. Pt ok to adjust her dose as tolerated and will recheck lipids in 3 months. LDL goal is < 130 for primary prevention.

## 2018-03-30 ENCOUNTER — Encounter: Payer: Self-pay | Admitting: Physician Assistant

## 2018-03-30 NOTE — Progress Notes (Signed)
Cardiology Office Note    Date:  04/01/2018  ID:  Sabrina Chambers 11-20-1945, MRN [id number] PCP:  System, Pcp Not In  Cardiologist:  Verne Carrow, MD   Chief Complaint: f/u cardiomyopathy  History of Present Illness:  Sabrina Chambers is a 72 y.o. female with history of palpitations, mitral valve prolapse (per pt report), HLD, possible nonischemic cardiomyopathy, diastolic dysfunction who is here today for annual follow-up. Dr. Gibson Ramp notes reviewed.  He saw her in May 2013 as a new patient for evaluation of palpitations and prior history of decreased LV systolic function. The studies he references are a normal cath in 1998, nuclear stress test in 2009 with LVEF 42%, and echo in 2010 with EF 50%. 2D echo upon his eval 12/2011 showed mild-moderate LVH, EF 60%, grade 1 DD, + atrial septal aneurysm, mild TR. At some point she followed up in [facility] and had issues with leg pain on statin therapy. She returned for follow-up 06/2016 as she was not happy with what she was told in that office. She was describing occasional chest pressure while at rest, rare palpitations and some LE edema. This was evaluated with echo 06/23/16 with normal LV systolic function EF 50-55%, grade 2 diastolic dysfunction, mildly dilated LA, no significant valve disease. Nuclear stress test 06/2016: low risk stress nuclear study with chest pain but images showed apical thinning and no ischemia; EF 66 with normal wall motion. Last labs: 01/2018 LDL 155 (lipids followed in our lipid clinic), 11/2016 glucose 89, BUN 16, Cr 0.73, Na 144, K 4.4, LFTs OK, no CBC on file. She has declined Lasix in the past for mild dependent edema.  She returns for follow-up today feeling well from cardiac standpoint. No chest pain, dyspnea, significant edema, palpitations or syncope. She was quite sick earlier this year with an episode of food poisoning and resultant diagnosis of ulcerative colitis. Colonoscopy planned for next month in [place]  where she lives. She travels between there and [place]. She recently mentioned to her primary care that she remotely had an abnormal CXR and PET scan so they did a CT scan that showed mild emphysema and multiple pulm nodules c/w granulomas. She has never smoked so she is quite concerned about this. She said they wanted to prescribe an inhaler but she declined, denying any symptoms. She says her PCP said to ask [place] about seeing her for pulmonology. Of note she was also followed by dermatology in the past for extreme itching treated with steroids that was eventually linked back to a filler ingredient in vitamins. This has resolved and she's no longer on steroids. She did not tolerate the Crestor tried by the lipid clinic. Primary care gave her pitavastatin to try, and asked her to start 1mg  daily.   Past Medical History:  Diagnosis Date  . Dependent edema   . Diastolic dysfunction   . Hyperlipidemia    mixed  . Mitral valve problem    BY HISTORY  . NICM (nonischemic cardiomyopathy) (HCC)    a. nuclear stress test in 2009 with LVEF 42%, and echo in 2010 with EF 50%. b. subsequent echoes with normal LVEF.  . 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 Stress incontinence     Past Surgical History:  Procedure Laterality Date  . ABDOMINAL HYSTERECTOMY    . CARDIAC CATHETERIZATION     1998 [facility] "CAD"  . OVARIAN CYST REMOVAL      Current Medications: Current Meds  Medication Sig  .  clindamycin (CLEOCIN) 300 MG capsule Take 300 mg by mouth 3 (three) times daily. PRIOR TO DENTAL SURGERY      Allergies:   Mepivacaine; Procaine; Penicillins; Sulfa antibiotics; Atorvastatin; Carbocaine [mepivacaine hcl]; Epinephrine; Pravastatin; and Rosuvastatin   Social History   Socioeconomic History  . Marital status: Married    Spouse name: Not on file  . Number of children: 3  . Years of education: Not on file  . Highest education level: Not on file  Occupational History  . Occupation: Manufacturing engineer  . Financial resource strain: Not on file  . Food insecurity:    Worry: Not on file    Inability: Not on file  . Transportation needs:    Medical: Not on file    Non-medical: Not on file  Tobacco Use  . Smoking status: Never Smoker  . Smokeless tobacco: Never Used  Substance and Sexual Activity  . Alcohol use: No  . Drug use: No  . Sexual activity: Not on file  Lifestyle  . Physical activity:    Days per week: Not on file    Minutes per session: Not on file  . Stress: Not on file  Relationships  . Social connections:    Talks on phone: Not on file    Gets together: Not on file    Attends religious service: Not on file    Active member of club or organization: Not on file    Attends meetings of clubs or organizations: Not on file    Relationship status: Not on file  Other Topics Concern  . Not on file  Social History Narrative  . Not on file     Family History:  The patient's family history includes Coronary artery disease in her other; Heart failure in her father; Hypertension in her other; Obesity in her other.  ROS:   Please see the history of present illness. All other systems are reviewed and otherwise negative.    PHYSICAL EXAM:   VS:  BP 128/90   Pulse 69   Ht 5\' 6"  (1.676 m)   Wt 194 lb (88 kg)   BMI 31.31 kg/m   BMI: Body mass index is 31.31 kg/m. recheck BP by me 142/90 GEN: Well nourished, well developed WF, in no acute distress HEENT: normocephalic, atraumatic Neck: no JVD, carotid bruits, or masses Cardiac: RRR; no murmurs, rubs, or gallops, no edema  Respiratory:  clear to auscultation bilaterally, normal work of breathing GI: soft, nontender, nondistended, + BS MS: no deformity or atrophy Skin: warm and dry, no rash Neuro:  Alert and Oriented x 3, Strength and sensation are intact, follows commands Psych: euthymic mood, full affect  Wt Readings from Last 3 Encounters:  04/01/18 194 lb (88 kg)  04/01/17 209 lb 3.2 oz (94.9 kg)   07/04/16 207 lb 9.6 oz (94.2 kg)      Studies/Labs Reviewed:   EKG:  EKG was ordered today and personally reviewed by me and demonstrates NSR 69bpm, nonspecific TW changes similar to prior.  Recent Labs: No results found for requested labs within last 8760 hours.   Lipid Panel    Component Value Date/Time   CHOL 241 (H) 02/10/2018 0814   TRIG 123 02/10/2018 0814   HDL 61 02/10/2018 0814   CHOLHDL 4.0 02/10/2018 0814   CHOLHDL 4.5 06/24/2016 0911   VLDL 18 06/24/2016 0911   LDLCALC 155 (H) 02/10/2018 1610    Additional studies/ records that were reviewed today include: Summarized  above    ASSESSMENT & PLAN:   1. Nonischemic cardiomyopathy - noted on nuc in 2009 which historically is not the best medium to evaluate LV function. Subsequent evaluations have been reassuring without wall motion abnormalities, low-normal LVEF. She appears euvolemic on exam. Continue surveillance for cardiac sx. She is awaiting upcoming colonoscopy. In terms of clearance she is cleared to proceed from our standpoint if clearance is needed. 2. Hyperlipidemia - starting pitavastatin from PCP. I told her if she tolerates this to call for appointment for f/u liver/lipids in 6 weeks. It appears she has a standing lab time already in September. She is historically intolerant to many other statins so if she fails this one, I do not know that she would qualify for a PCSK9 given lack of atherosclerotic disease. Appreciate lipid clinic involvement. 3. Diastolic dysfunction - appears euvolemic. She does have elevated BP reading in clinic today without diagnosis of HTN. BP was 142/90 by my recheck. The patient was instructed to monitor their blood pressure at home and to call if tending to run higher than 130 systolic or 80 diastolic. 4. Pulmonary nodules - I told her we do not evaluate these in our practice but am happy to refer to pulm. She wishes to see the local group here in [place]. 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 Likely incidental pickup. No cardiac sx or hx of TIA/stroke. Follow.  Disposition: F/u with Dr. Clifton James in 1 year.   Medication Adjustments/Labs and Tests Ordered: Current medicines are reviewed at length with the patient today.  Concerns regarding medicines are outlined above. Medication changes, Labs and Tests ordered today are summarized above and listed in the Patient Instructions accessible in Encounters.   Signed, Laurann Montana, PA-C  04/01/2018 1:52 PM    [facility] [address] [place], [place], [place]  [address] Phone: [phone]; Fax: [phone]

## 2018-04-01 ENCOUNTER — Ambulatory Visit (INDEPENDENT_AMBULATORY_CARE_PROVIDER_SITE_OTHER): Payer: Medicare Other | Admitting: Physician Assistant

## 2018-04-01 ENCOUNTER — Encounter: Payer: Self-pay | Admitting: Physician Assistant

## 2018-04-01 VITALS — BP 128/90 | HR 69 | Ht 66.0 in | Wt 194.0 lb

## 2018-04-01 DIAGNOSIS — E785 Hyperlipidemia, unspecified: Secondary | ICD-10-CM | POA: Diagnosis not present

## 2018-04-01 DIAGNOSIS — R030 Elevated blood-pressure reading, without diagnosis of hypertension: Secondary | ICD-10-CM

## 2018-04-01 DIAGNOSIS — I428 Other cardiomyopathies: Secondary | ICD-10-CM

## 2018-04-01 DIAGNOSIS — R911 Solitary pulmonary nodule: Secondary | ICD-10-CM

## 2018-04-01 DIAGNOSIS — I5189 Other ill-defined heart diseases: Secondary | ICD-10-CM

## 2018-04-01 DIAGNOSIS — R600 Localized edema: Secondary | ICD-10-CM | POA: Insufficient documentation

## 2018-04-01 NOTE — Patient Instructions (Addendum)
Medication Instructions:  Your physician recommends that you continue on your current medications as directed. Please refer to the Current Medication list given to you today.  Labwork: None ordered  Testing/Procedures: None ordered  Follow-Up: You have been referred to [facility] for Lund Nodules.  They will contact you with an appt.  Your physician wants you to follow-up in: 1 YEAR WITH DR. Clifton James You will receive a reminder letter in the mail two months in advance. If you don't receive a letter, please call our office to schedule the follow-up appointment.    Any Other Special Instructions Will Be Listed Below (If Applicable).  If you find that you tolerate the pitavastatin, you should have your liver function and cholesterol profile checked about 6 weeks after initiation. Feel free to call our office to arrange. You can also have the results drawn locally where you live and sent to our lipid clinic.     If you need a refill on your cardiac medications before your next appointment, please call your pharmacy.

## 2018-05-10 ENCOUNTER — Institutional Professional Consult (permissible substitution): Payer: Medicare Other | Admitting: Emergency Medicine

## 2018-05-19 ENCOUNTER — Other Ambulatory Visit: Payer: Medicare Other | Admitting: *Deleted

## 2018-05-19 ENCOUNTER — Ambulatory Visit (INDEPENDENT_AMBULATORY_CARE_PROVIDER_SITE_OTHER): Payer: Medicare Other | Admitting: Emergency Medicine

## 2018-05-19 ENCOUNTER — Encounter (INDEPENDENT_AMBULATORY_CARE_PROVIDER_SITE_OTHER): Payer: Self-pay

## 2018-05-19 ENCOUNTER — Encounter: Payer: Self-pay | Admitting: Emergency Medicine

## 2018-05-19 DIAGNOSIS — Z23 Encounter for immunization: Secondary | ICD-10-CM

## 2018-05-19 DIAGNOSIS — R918 Other nonspecific abnormal finding of lung field: Secondary | ICD-10-CM

## 2018-05-19 DIAGNOSIS — R0989 Other specified symptoms and signs involving the circulatory and respiratory systems: Secondary | ICD-10-CM | POA: Diagnosis not present

## 2018-05-19 DIAGNOSIS — E782 Mixed hyperlipidemia: Secondary | ICD-10-CM

## 2018-05-19 LAB — LIPID PANEL
CHOL/HDL RATIO: 4 ratio (ref 0.0–4.4)
Cholesterol, Total: 237 mg/dL — ABNORMAL HIGH (ref 100–199)
HDL: 59 mg/dL (ref >39)
LDL CALC: 161 mg/dL — AB (ref 0–99)
TRIGLYCERIDES: 83 mg/dL (ref 0–149)
VLDL Cholesterol Cal: 17 mg/dL (ref 5–40)

## 2018-05-19 NOTE — Assessment & Plan Note (Signed)
There was some question of hyperinflation on her CT chest that was done 12/03/2017.  This is a nonspecific finding, unclear whether there is any clinical correlate.  She denies any symptoms consistent with COPD.  She is a never smoker.  She does not have asthma or asthmatic symptoms.  I think that this is an over call.  If he wanted to prove it definitively we would perform pulmonary function testing to identify any true obstructive lung disease.  For now we will defer.  If she feels that she needs definitive testing, for example in order to get the diagnosis of COPD off of her past medical history, then certainly we can arrange for PFT.

## 2018-05-19 NOTE — Assessment & Plan Note (Signed)
Multiple small calcified granulomas.  These typically would not need follow-up in a never smoker.  She tells me that she had a remote chest x-ray and PET scan that identified granulomas 6 or 7 years ago in [place].  I think will be reasonable to try to obtain all of the films to compare.  We will do so if she decides that she would like this information.  For now she wants to think about it.

## 2018-05-19 NOTE — Progress Notes (Signed)
Subjective:    Patient ID: Sabrina Chambers, female    DOB: 09-23-1945, 72 y.o.   MRN: [id number]  HPI 72 year old never smoker with a history of hypertension and diastolic dysfunction, nonischemic systolic dysfunction, mitral valve prolapse, hyperlipidemia followed at [facility] heart care.  She has a fairly new diagnosis of ulcerative colitis, has allergic rhinitis.   Apparently she had remotely had an abnormal chest x-ray and a PET scan (6-7 yrs ago for fatigue, in [place], [place]) which prompted a CXR 10/2017 and then CT chest done 12/03/17.  This was done in [place] and I have the report available.  It shows that there is right apical scarring, multiple linear basilar opacities consistent with atelectasis or scar, fat-containing diaphragmatic hernia and some mild hyper expansion.  There were multiple calcified bilateral granulomas that appear to all be smaller than 3 mm.   She is here to discuss the possibility that she could have COPD based on the CT results. Also here to discuss the appropriate f/u of the small calcified nodules. She denies any dyspnea or wheeze. She has some occasional cough that she ascribes to allergies.    Review of Systems  Constitutional: Negative for fever and unexpected weight change.  HENT: Negative for congestion, dental problem, ear pain, nosebleeds, postnasal drip, rhinorrhea, sinus pressure, sneezing, sore throat and trouble swallowing.   Eyes: Negative for redness and itching.  Respiratory: Negative for cough, chest tightness, shortness of breath and wheezing.   Cardiovascular: Negative for palpitations and leg swelling.  Gastrointestinal: Negative for nausea and vomiting.  Genitourinary: Negative for dysuria.  Musculoskeletal: Negative for joint swelling.  Skin: Negative for rash.  Neurological: Negative for headaches.  Hematological: Does not bruise/bleed easily.  Psychiatric/Behavioral: Negative for dysphoric mood. The patient is not nervous/anxious.      Past Medical History:  Diagnosis Date  . Dependent edema   . Diastolic dysfunction   . Hyperlipidemia    mixed  . Mitral valve problem    BY HISTORY  . NICM (nonischemic cardiomyopathy) (HCC)    a. nuclear stress test in 2009 with LVEF 42%, and echo in 2010 with EF 50%. b. subsequent echoes with normal LVEF.  . 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 Stress incontinence      Family History  Problem Relation Age of Onset  . Coronary artery disease Other   . Obesity Other   . Hypertension Other   . Heart failure Father      Social History   Socioeconomic History  . Marital status: Married    Spouse name: Not on file  . Number of children: 3  . Years of education: Not on file  . Highest education level: Not on file  Occupational History  . Occupation: Air cabin crew  . Financial resource strain: Not on file  . Food insecurity:    Worry: Not on file    Inability: Not on file  . Transportation needs:    Medical: Not on file    Non-medical: Not on file  Tobacco Use  . Smoking status: Never Smoker  . Smokeless tobacco: Never Used  Substance and Sexual Activity  . Alcohol use: No  . Drug use: No  . Sexual activity: Not on file  Lifestyle  . Physical activity:    Days per week: Not on file    Minutes per session: Not on file  . Stress: Not on file  Relationships  . Social connections:  Talks on phone: Not on file    Gets together: Not on file    Attends religious service: Not on file    Active member of club or organization: Not on file    Attends meetings of clubs or organizations: Not on file    Relationship status: Not on file  . Intimate partner violence:    Fear of current or ex partner: Not on file    Emotionally abused: Not on file    Physically abused: Not on file    Forced sexual activity: Not on file  Other Topics Concern  . Not on file  Social History Narrative  . Not on file  has lived in [place], [place], [place], [place] Has travelled to  [place], the [place].  Has worked as Diplomatic Services operational officer, in Photographer, Radio producer.  No TB exposure.   Allergies  Allergen Reactions  . Mepivacaine Anaphylaxis  . Procaine Other (See Comments)    Irregular heartbeat  . Penicillins Other (See Comments)    Weakness, vision problems, dyspnea, sweating  . Sulfa Antibiotics Other (See Comments)    Topical cream applied - made her rash worse where she applied it.  . Atorvastatin   . Carbocaine [Mepivacaine Hcl]     Anaphylaxis- pt stopped breathing  . Epinephrine Other (See Comments)    "extremem rapid heart beat"  . Pravastatin     Extreme pain in joints  . Rosuvastatin     Myalgias on twice weekly crestor     Outpatient Medications Prior to Visit  Medication Sig Dispense Refill  . clindamycin (CLEOCIN) 300 MG capsule Take 300 mg by mouth 3 (three) times daily. PRIOR TO DENTAL SURGERY     No facility-administered medications prior to visit.         Objective:   Physical Exam Vitals:   05/19/18 1358  BP: 128/88  Pulse: 68  SpO2: 97%  Weight: 199 lb (90.3 kg)  Height: 5\' 5"  (1.651 m)   Gen: Pleasant, well-nourished, in no distress,  normal affect  ENT: No lesions,  mouth clear,  oropharynx clear, no postnasal drip  Neck: No JVD, no stridor  Lungs: No use of accessory muscles, no wheeze or crackles  Cardiovascular: RRR, heart sounds normal, no murmur or gallops, no peripheral edema  Musculoskeletal: No deformities, no cyanosis or clubbing  Neuro: alert, non focal  Skin: Warm, no lesions or rash      Assessment & Plan:  Pulmonary nodules Multiple small calcified granulomas.  These typically would not need follow-up in a never smoker.  She tells me that she had a remote chest x-ray and PET scan that identified granulomas 6 or 7 years ago in [place].  I think will be reasonable to try to obtain all of the films to compare.  We will do so if she decides that she would like this information.  For now she  wants to think about it.  Hyperinflation of lungs There was some question of hyperinflation on her CT chest that was done 12/03/2017.  This is a nonspecific finding, unclear whether there is any clinical correlate.  She denies any symptoms consistent with COPD.  She is a never smoker.  She does not have asthma or asthmatic symptoms.  I think that this is an over call.  If he wanted to prove it definitively we would perform pulmonary function testing to identify any true obstructive lung disease.  For now we will defer.  If she feels that she needs definitive  testing, for example in order to get the diagnosis of COPD off of her past medical history, then certainly we can arrange for PFT.  Levy Pupa, MD, PhD 05/19/2018, 5:05 PM [facility] [phone] or if no answer [phone]

## 2018-05-19 NOTE — Patient Instructions (Addendum)
Your small pulmonary nodules should not need to be followed based on their size and your low-risk profile. It may be reasonable for [place] to obtain your old scans from [place] to compare them side by side for stability.  There is no clinical evidence to support a diagnosis of COPD or emphysema. The definitive way to check for this would be to perform pulmonary function testing.  You do not need any inhaled medication.  Consider using an over-the-counter antihistamine like loratadine (Claritin) as needed for allergy symptoms.  Follow with Dr Delton Coombes if you would like to pursue either of these issues with further testing.

## 2018-09-24 ENCOUNTER — Telehealth: Payer: Self-pay | Admitting: Emergency Medicine

## 2018-09-24 NOTE — Telephone Encounter (Signed)
Spoke with pt and advised her that there was no letter in the system but I could send her a copy of the OV note that clearly states RB recommendations and thoughts about the diagnosis. Pt agreed that this would be sufficient. Pt understood and I mailed OV note to her address. Nothing further is needed.

## 2018-09-24 NOTE — Telephone Encounter (Signed)
She is requesting a copy of the letter mailed to her address on file or e-mail to [email or web address].

## 2019-02-03 ENCOUNTER — Telehealth: Payer: Self-pay | Admitting: Cardiovascular Disease

## 2019-02-03 DIAGNOSIS — E785 Hyperlipidemia, unspecified: Secondary | ICD-10-CM

## 2019-02-03 MED ORDER — EVOLOCUMAB 140 MG/ML ~~LOC~~ SOAJ: 1.0000 "pen " | SUBCUTANEOUS | 11 refills | Status: DC

## 2019-02-03 NOTE — Telephone Encounter (Signed)
Returned call to patient. Pt has previously been managed by PharmD for lipids, however she is intolerant to rosuvastatin 5mg  once weekly, pravastatin 20mg  daily, Livalo, Zetia 10mg  daily, and Welchol 1875mg  BID - myalgias with statins and GI issues with Zetia.  Pt is primary prevention however her father did pass away from heart disease. Discussed PCSK9i and Nexletol with pt as coverage has been better for primary prevention for PCSK9i since we spoke last with pt. She is agreeable to trying Repatha. Submitted PA for Repatha which was approved immediately through 02/02/20.  Unable to activate copay card, called pharmacy to determine copay but [facility] is closed [facility] Saturday? Will follow up next week with copay information.

## 2019-02-03 NOTE — Telephone Encounter (Signed)
Patient called wanting to speak to you about her cholesterol medication

## 2019-02-07 NOTE — Telephone Encounter (Signed)
Called pharmacy, Repatha copay is $35, was advised that pt picked up rx on 6/6. LMOM for pt to call if she has any trouble with injections. She will also need follow up lab work scheduled.

## 2019-02-14 NOTE — Telephone Encounter (Signed)
Pt returned call to clinic and states she gave her first Repatha injection last week. She also had follow up with her PCP who checked her lipids the morning after her first injection - this should be a good baseline. Scheduled follow up lipid panel after 4 injections.

## 2019-02-14 NOTE — Addendum Note (Signed)
Addended by: Ilina Xu E on: 02/14/2019 02:44 PM   Modules accepted: Orders

## 2019-03-29 ENCOUNTER — Other Ambulatory Visit: Payer: Medicare Other

## 2019-04-01 ENCOUNTER — Other Ambulatory Visit: Payer: Medicare Other

## 2019-04-05 ENCOUNTER — Telehealth: Payer: Self-pay | Admitting: Pharmacist

## 2019-04-05 NOTE — Telephone Encounter (Signed)
Called pt to reschedule Repatha labs since she missed lab appt. She states she noticed myalgias and joint pain with her first dose of Repatha that was much worse than when she took statins, to the point where she had trouble walking. She is previously intolerant to rosuvastatin 5mg  once weekly, pravastatin 20mg  daily, Livalo, Zetia 10mg  daily, and Welchol 1875mg  BID. Unfortunately insurance will not cover Nexletol for primary prevention. Her PCP checked baseline lipids before starting her Repatha - will have these results faxed to our office for an updated baseline. Will keep pt in mind for any future lipid trials - she does not currently qualify for any with [facility].

## 2019-06-23 ENCOUNTER — Ambulatory Visit: Payer: Medicare Other | Admitting: Cardiovascular Disease

## 2019-06-23 NOTE — Progress Notes (Deleted)
No chief complaint on file.   History of Present Illness: 73 yo female with history of palpitations, mitral valve prolapse and hyperlipidemia here today for follow up. I saw her once in May 2013 as a new patient for evaluation of palpitations and decreased LV systolic function. Normal cardiac cath at [facility]. Echo 2010 with LVEF 50%. Septal hypertrophy, mild. Mild MR. Moderate LAE. Myoview 2009 with LVEF 42%. I ordered an echo at her first visit here in 2013. This showed normal LV systolic function with LVEF=60%. No mitral regurgitation. She was told in the past she had mitral valve prolapse. She has not tolerated statins. She has been followed by a cardiologist in [place], [place] and she feels that her legs still hurt because of the statin that he had prescribed. She was seen in October 2017 in our office as she was not happy with what she was told in that office. She described occasional chest pressure while at rest, rare palpitations and some LE edema.  Echo 06/23/16 with normal LV systolic function, grade 2 diastolic dysfunction, no significant valve disease. Nuclear stress test October 2017 with no ischemia. She tried Repatha but stopped due to myalgias and joint pain.   She is here today for follow up. The patient denies any chest pain, dyspnea, palpitations, lower extremity edema, orthopnea, PND, dizziness, near syncope or syncope.   Primary Care Physician: System, Pcp Not In   Past Medical History:  Diagnosis Date  . Dependent edema   . Diastolic dysfunction   . Hyperlipidemia    mixed  . Mitral valve problem    BY HISTORY  . NICM (nonischemic cardiomyopathy) (HCC)    a. nuclear stress test in 2009 with LVEF 42%, and echo in 2010 with EF 50%. b. subsequent echoes with normal LVEF.  . 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 Stress incontinence     Past Surgical History:  Procedure Laterality Date  . ABDOMINAL HYSTERECTOMY    . CARDIAC CATHETERIZATION     1998 [facility] "CAD"  .  OVARIAN CYST REMOVAL      Current Outpatient Medications  Medication Sig Dispense Refill  . clindamycin (CLEOCIN) 300 MG capsule Take 300 mg by mouth 3 (three) times daily. PRIOR TO DENTAL SURGERY     No current facility-administered medications for this visit.     Allergies  Allergen Reactions  . Mepivacaine Anaphylaxis  . Procaine Other (See Comments)    Irregular heartbeat  . Penicillins Other (See Comments)    Weakness, vision problems, dyspnea, sweating  . Sulfa Antibiotics Other (See Comments)    Topical cream applied - made her rash worse where she applied it.  . Atorvastatin   . Carbocaine [Mepivacaine Hcl]     Anaphylaxis- pt stopped breathing  . Epinephrine Other (See Comments)    "extremem rapid heart beat"  . Pravastatin     Extreme pain in joints  . Repatha [Evolocumab]     Myalgias and joint pain  . Rosuvastatin     Myalgias on twice weekly crestor    Social History   Socioeconomic History  . Marital status: Married    Spouse name: Not on file  . Number of children: 3  . Years of education: Not on file  . Highest education level: Not on file  Occupational History  . Occupation: Air cabin crew  . Financial resource strain: Not on file  . Food insecurity    Worry: Not on file  Inability: Not on file  . Transportation needs    Medical: Not on file    Non-medical: Not on file  Tobacco Use  . Smoking status: Never Smoker  . Smokeless tobacco: Never Used  Substance and Sexual Activity  . Alcohol use: No  . Drug use: No  . Sexual activity: Not on file  Lifestyle  . Physical activity    Days per week: Not on file    Minutes per session: Not on file  . Stress: Not on file  Relationships  . Social Musician on phone: Not on file    Gets together: Not on file    Attends religious service: Not on file    Active member of club or organization: Not on file    Attends meetings of clubs or organizations: Not on file     Relationship status: Not on file  . Intimate partner violence    Fear of current or ex partner: Not on file    Emotionally abused: Not on file    Physically abused: Not on file    Forced sexual activity: Not on file  Other Topics Concern  . Not on file  Social History Narrative  . Not on file    Family History  Problem Relation Age of Onset  . Coronary artery disease Other   . Obesity Other   . Hypertension Other   . Heart failure Father     Review of Systems:  As stated in the HPI and otherwise negative.   There were no vitals taken for this visit.  Physical Examination:  General: Well developed, well nourished, NAD  HEENT: OP clear, mucus membranes moist  SKIN: warm, dry. No rashes. Neuro: No focal deficits  Musculoskeletal: Muscle strength 5/5 all ext  Psychiatric: Mood and affect normal  Neck: No JVD, no carotid bruits, no thyromegaly, no lymphadenopathy.  Lungs:Clear bilaterally, no wheezes, rhonci, crackles Cardiovascular: Regular rate and rhythm. No murmurs, gallops or rubs. Abdomen:Soft. Bowel sounds present. Non-tender.  Extremities: No lower extremity edema. Pulses are 2 + in the bilateral DP/PT.  Echo 06/23/16: Left ventricle: The cavity size was normal. Systolic function was   normal. The estimated ejection fraction was in the range of 50%   to 55%. Wall motion was normal; there were no regional wall   motion abnormalities. Features are consistent with a pseudonormal   left ventricular filling pattern, with concomitant abnormal   relaxation and increased filling pressure (grade 2 diastolic   dysfunction). Doppler parameters are consistent with   indetermiante ventricular filling pressure. - Aortic valve: Transvalvular velocity was within the normal range.   There was no stenosis. There was no regurgitation. - Mitral valve: Transvalvular velocity was within the normal range.   There was no evidence for stenosis. There was trivial   regurgitation. - Left  atrium: The atrium was mildly dilated. - Right ventricle: The cavity size was normal. Wall thickness was   normal. Systolic function was normal. - Atrial septum: No defect or patent foramen ovale was identified   by color flow Doppler. - Tricuspid valve: There was trivial regurgitation. - Pulmonary arteries: Systolic pressure was within the normal   range. PA peak pressure: 13 mm Hg (S).  Nuclear stress test 06/24/16:  Nuclear stress EF: 66%.  Blood pressure demonstrated a normal response to exercise.  There was no ST segment deviation noted during stress.  Defect 1: There is a small defect of severe severity present in the apex  location.  This is a low risk study.  The left ventricular ejection fraction is hyperdynamic (>65%).   Low risk stress nuclear study with chest pain but images show apical thinning and no ischemia; EF 66 with normal wall motion. .  EKG:  EKG is *** ordered today. The ekg ordered today demonstrates   Recent Labs: No results found for requested labs within last 8760 hours.   Lipid Panel    Component Value Date/Time   CHOL 237 (H) 05/19/2018 0828   TRIG 83 05/19/2018 0828   HDL 59 05/19/2018 0828   CHOLHDL 4.0 05/19/2018 0828   CHOLHDL 4.5 06/24/2016 0911   VLDL 18 06/24/2016 0911   LDLCALC 161 (H) 05/19/2018 0828     Wt Readings from Last 3 Encounters:  05/19/18 199 lb (90.3 kg)  04/01/18 194 lb (88 kg)  04/01/17 209 lb 3.2 oz (94.9 kg)     Other studies Reviewed: Additional studies/ records that were reviewed today include: . Review of the above records demonstrates:   Assessment and Plan:   1. Non-ischemic cardiomyopathy: Normal LV function by echo October 2017. Lower EF reported in the past.   2. Hyperlipidemia: Statin intolerant in the past. She tried Repatha but *** . She is now on ***. ? Lipid clinic referral ***  3. Lower extremity edema: Dependent edema. Has not wanted to try Lasix in the past.    Current medicines are  reviewed at length with the patient today.  The patient does not have concerns regarding medicines.  The following changes have been made:  no change  Labs/ tests ordered today include:   No orders of the defined types were placed in this encounter.  Disposition:   FU with me in 12 months  Signed, Lauree Chandler, MD 06/23/2019 7:07 AM    [facility] [address], [place], [place]  [address] Phone: [phone]; Fax: [phone]

## 2019-10-10 ENCOUNTER — Other Ambulatory Visit: Payer: Self-pay

## 2019-10-10 ENCOUNTER — Ambulatory Visit (INDEPENDENT_AMBULATORY_CARE_PROVIDER_SITE_OTHER): Payer: Medicare Other | Admitting: Cardiovascular Disease

## 2019-10-10 ENCOUNTER — Encounter: Payer: Self-pay | Admitting: Cardiovascular Disease

## 2019-10-10 VITALS — BP 118/86 | HR 83 | Ht 65.0 in | Wt 208.2 lb

## 2019-10-10 DIAGNOSIS — E785 Hyperlipidemia, unspecified: Secondary | ICD-10-CM | POA: Diagnosis not present

## 2019-10-10 DIAGNOSIS — I428 Other cardiomyopathies: Secondary | ICD-10-CM

## 2019-10-10 DIAGNOSIS — M6281 Muscle weakness (generalized): Secondary | ICD-10-CM | POA: Diagnosis not present

## 2019-10-10 NOTE — Patient Instructions (Signed)
Medication Instructions:  No changes *If you need a refill on your cardiac medications before your next appointment, please call your pharmacy*  Lab Work: Today:  CK If you have labs (blood work) drawn today and your tests are completely normal, you will receive your results only by: Marland Kitchen MyChart Message (if you have MyChart) OR . A paper copy in the mail If you have any lab test that is abnormal or we need to change your treatment, we will call you to review the results.  Testing/Procedures: none  Follow-Up: At [facility], you and your health needs are our priority.  As part of our continuing mission to provide you with exceptional heart care, we have created designated Provider Care Teams.  These Care Teams include your primary Cardiologist (physician) and Advanced Practice Providers (APPs -  Physician Assistants and Nurse Practitioners) who all work together to provide you with the care you need, when you need it.  Your next appointment:   12 month(s)  The format for your next appointment:   Either In Person or Virtual  Provider:   You may see Verne Carrow, MD or one of the following Advanced Practice Providers on your designated Care Team:    Ronie Spies, PA-C  Jacolyn Reedy, PA-C   Other Instructions

## 2019-10-10 NOTE — Progress Notes (Signed)
Chief Complaint  Patient presents with  . Follow-up    Palpitations    History of Present Illness: 74 yo female with history of palpitations, mitral valve prolapse and HLD who is here today for follow up. I saw her once in May 2013 as a new patient for evaluation of palpitations and decreased LV systolic function. Normal cardiac cath at [facility]. Echo 2010 with LVEF 50%. Septal hypertrophy, mild. Mild MR. Moderate LAE. Myoview 2009 with LVEF 42%. I ordered an echo at her first visit here in 2013. This showed normal LV systolic function with UKGU=54%. No mitral regurgitation. She was told in the past she had mitral valve prolapse. She has not tolerated statins. She has been followed by a cardiologist in [place], [place] and she feels that her legs still hurt because of the statin that he had prescribed. She was seen in October 2017 in our office as she was not happy with what she was told in that office. She described occasional chest pressure while at rest, rare palpitations and some LE edema.  Echo 06/23/16 with normal LV systolic function, grade 2 diastolic dysfunction, no significant valve disease. Nuclear stress test October 2017 with no ischemia. She has not tolerated statins or Repatha due to muscle aches. Her insurance does not cover Nexletol.   She is here today for follow up. The patient denies any chest pain, dyspnea, palpitations, orthopnea, PND, dizziness, near syncope or syncope. LE edema resolves at night. Still having leg weakness and pains following her Repatha injection.   Primary Care Physician: System, Pcp Not In [facility] [place], [place])  Past Medical History:  Diagnosis Date  . Dependent edema   . Diastolic dysfunction   . Hyperlipidemia    mixed  . Mitral valve problem    BY HISTORY  . NICM (nonischemic cardiomyopathy) ([place])    a. nuclear stress test in 2009 with LVEF 42%, and echo in 2010 with EF 50%. b. subsequent echoes with normal LVEF.  .  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 Stress incontinence     Past Surgical History:  Procedure Laterality Date  . ABDOMINAL HYSTERECTOMY    . CARDIAC CATHETERIZATION     1998 [facility] "CAD"  . OVARIAN CYST REMOVAL      Current Outpatient Medications  Medication Sig Dispense Refill  . aspirin EC 81 MG tablet Take 81 mg by mouth at bedtime.    . clindamycin (CLEOCIN) 300 MG capsule Take 300 mg by mouth 3 (three) times daily. PRIOR TO DENTAL SURGERY     No current facility-administered medications for this visit.    Allergies  Allergen Reactions  . Mepivacaine Anaphylaxis  . Procaine Other (See Comments)    Irregular heartbeat  . Penicillins Other (See Comments)    Weakness, vision problems, dyspnea, sweating  . Sulfa Antibiotics Other (See Comments)    Topical cream applied - made her rash worse where she applied it.  . Atorvastatin   . Carbocaine [Mepivacaine Hcl]     Anaphylaxis- pt stopped breathing  . Epinephrine Other (See Comments)    "extremem rapid heart beat"  . Pravastatin     Extreme pain in joints  . Repatha [Evolocumab]     Myalgias and joint pain  . Rosuvastatin     Myalgias on twice weekly crestor    Social History   Socioeconomic History  . Marital status: Married    Spouse name: Not on file  . Number  of children: 3  . Years of education: Not on file  . Highest education level: Not on file  Occupational History  . Occupation: Surveyor, minerals  Tobacco Use  . Smoking status: Never Smoker  . Smokeless tobacco: Never Used  Substance and Sexual Activity  . Alcohol use: No  . Drug use: No  . Sexual activity: Not on file  Other Topics Concern  . Not on file  Social History Narrative  . Not on file   Social Determinants of Health   Financial Resource Strain:   . Difficulty of Paying Living Expenses: Not on file  Food Insecurity:   . Worried About Programme researcher, broadcasting/film/video in the Last Year: Not on file  . Ran Out of Food in the Last Year: Not on file    Transportation Needs:   . Lack of Transportation (Medical): Not on file  . Lack of Transportation (Non-Medical): Not on file  Physical Activity:   . Days of Exercise per Week: Not on file  . Minutes of Exercise per Session: Not on file  Stress:   . Feeling of Stress : Not on file  Social Connections:   . Frequency of Communication with Friends and Family: Not on file  . Frequency of Social Gatherings with Friends and Family: Not on file  . Attends Religious Services: Not on file  . Active Member of Clubs or Organizations: Not on file  . Attends Banker Meetings: Not on file  . Marital Status: Not on file  Intimate Partner Violence:   . Fear of Current or Ex-Partner: Not on file  . Emotionally Abused: Not on file  . Physically Abused: Not on file  . Sexually Abused: Not on file    Family History  Problem Relation Age of Onset  . Coronary artery disease Other   . Obesity Other   . Hypertension Other   . Heart failure Father     Review of Systems:  As stated in the HPI and otherwise negative.   BP 118/86   Pulse 83   Ht 5\' 5"  (1.651 m)   Wt 208 lb 3.2 oz (94.4 kg)   SpO2 94%   BMI 34.65 kg/m   Physical Examination:  General: Well developed, well nourished, NAD  HEENT: OP clear, mucus membranes moist  SKIN: warm, dry. No rashes. Neuro: No focal deficits  Musculoskeletal: Muscle strength 5/5 all ext  Psychiatric: Mood and affect normal  Neck: No JVD, no carotid bruits, no thyromegaly, no lymphadenopathy.  Lungs:Clear bilaterally, no wheezes, rhonci, crackles Cardiovascular: Regular rate and rhythm. No murmurs, gallops or rubs. Abdomen:Soft. Bowel sounds present. Non-tender.  Extremities: No lower extremity edema. Pulses are 2 + in the bilateral DP/PT.  Echo 06/23/16: Left ventricle: The cavity size was normal. Systolic function was   normal. The estimated ejection fraction was in the range of 50%   to 55%. Wall motion was normal; there were no  regional wall   motion abnormalities. Features are consistent with a pseudonormal   left ventricular filling pattern, with concomitant abnormal   relaxation and increased filling pressure (grade 2 diastolic   dysfunction). Doppler parameters are consistent with   indetermiante ventricular filling pressure. - Aortic valve: Transvalvular velocity was within the normal range.   There was no stenosis. There was no regurgitation. - Mitral valve: Transvalvular velocity was within the normal range.   There was no evidence for stenosis. There was trivial   regurgitation. - Left atrium: The atrium was mildly  dilated. - Right ventricle: The cavity size was normal. Wall thickness was   normal. Systolic function was normal. - Atrial septum: No defect or patent foramen ovale was identified   by color flow Doppler. - Tricuspid valve: There was trivial regurgitation. - Pulmonary arteries: Systolic pressure was within the normal   range. PA peak pressure: 13 mm Hg (S).  Nuclear stress test 06/24/16:  Nuclear stress EF: 66%.  Blood pressure demonstrated a normal response to exercise.  There was no ST segment deviation noted during stress.  Defect 1: There is a small defect of severe severity present in the apex location.  This is a low risk study.  The left ventricular ejection fraction is hyperdynamic (>65%).   Low risk stress nuclear study with chest pain but images show apical thinning and no ischemia; EF 66 with normal wall motion. .  EKG:  EKG is ordered today. The ekg ordered today demonstrates NSR, rate 83 bpm  Recent Labs: No results found for requested labs within last 8760 hours.   Lipid Panel    Component Value Date/Time   CHOL 237 (H) 05/19/2018 0828   TRIG 83 05/19/2018 0828   HDL 59 05/19/2018 0828   CHOLHDL 4.0 05/19/2018 0828   CHOLHDL 4.5 06/24/2016 0911   VLDL 18 06/24/2016 0911   LDLCALC 161 (H) 05/19/2018 0828     Wt Readings from Last 3 Encounters:    10/10/19 208 lb 3.2 oz (94.4 kg)  05/19/18 199 lb (90.3 kg)  04/01/18 194 lb (88 kg)      Assessment and Plan:   1. Chest pain: She has had no recent chest pain. She is not known to have CAD. Normal stress test in 2017 with normal LV function.   2. Non-ischemic cardiomyopathy: Normal LV function by echo October 2017. Lower EF reported in the past. Weight is stable.   3. Hyperlipidemia: Statin intolerant. She also did not tolerate Repatha. Her lipids are followed in primary care.   4. Lower extremity edema: Dependent edema. She has not wanted to start Lasix in the past. Her edema resolves at night. She will call with changes.   5. Muscle weakness: She has had weakness and aches in her legs since being on statins and then worsened when she tried Repatha. Will check CK level today.   Current medicines are reviewed at length with the patient today.  The patient does not have concerns regarding medicines.  The following changes have been made:  no change  Labs/ tests ordered today include:   Orders Placed This Encounter  Procedures  . CK  . EKG 12-Lead   Disposition:   FU with me in 12 months  Signed, Verne Carrow, MD 10/10/2019 3:14 PM    [facility] [address] [place], [place], [place]  [address] Phone: [phone]; Fax: [phone]

## 2019-10-11 LAB — CK: Total CK: 56 U/L (ref 32–182)

## 2020-01-30 NOTE — Progress Notes (Signed)
Cardiology Office Note:    Date:  01/31/2020   ID:  SHAILYNN Chambers, DOB 09-19-1945, MRN [id number]  PCP:  System, Pcp Not In  [place], PA-C in [place], [place] at [facility] Phone # [phone] Cardiologist:  Verne Carrow, MD   Electrophysiologist:  None   Referring MD: No ref. provider found   Chief Complaint:  Palpitations    Patient Profile:    Sabrina Chambers is a 74 y.o. female with:   Palpitations  Mitral Valve Prolapse   Hyperlipidemia   Intol of statins  Non-ischemic cardiomyopathy   Cath at [facility]: normal   EF 42 on Myoview in 2009  Echocardiogram in 2010 with EF 50  Echocardiogram in 2013: EF 60  Echocardiogram 10/17: Normal EF  Myoview in 2017: low risk     Prior CV studies: Myoview 06/24/16 EF 66, apical thinning, no ischemia; Low Risk  Echocardiogram 06/23/16 EF 50-55, no RWMA, Gr 2 DD, trivial MR, mild LAE, normal RVSF, trivial TR, PASP 13   History of Present Illness:    Ms. Sabrina Chambers was last seen by Dr. Clifton James in 10/2019.  She returns for evaluation of palpitations, dizziness/near syncope and shortness of breath.  She had a dental procedure (cleaning) in February 2021.  She had an infection that required local antibiotic treatment x2.  She did not take oral antibiotics.  She is also been followed by ophthalmologist for what sounds like a retinal hemorrhage.  She has been receiving eye injections.  She also had her second COVID-19 vaccine in March.  Over the last several months, she has had several different types of palpitations.  One type is a rapid heartbeat.  Some palpitations are more of a skipping sensation.  She does note dyspnea with some activities.  She has had an occasional sharp chest pain that is brief (less than 5 seconds).  She has a lot of dizziness.  She has not had syncope.  But she did have 2 episodes of near syncope about 2 weeks apart.  She cannot relate anything specific that triggered this.  She has not had  orthopnea.  She has some lower extremity swelling when she is on her feet for too long.  She has been seen by primary care.  They asked that she follow-up with cardiology.  She is scheduled for a brain MRI in the near future.     Past Medical History:  Diagnosis Date  . Dependent edema   . Diastolic dysfunction   . Hyperlipidemia    mixed  . Mitral valve problem    BY HISTORY  . NICM (nonischemic cardiomyopathy) ([facility])    a. nuclear stress test in 2009 with LVEF 42%, and echo in 2010 with EF 50%. b. subsequent echoes with normal LVEF.  . 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 Stress incontinence     Current Medications: Current Meds  Medication Sig  . aspirin EC 81 MG tablet Take 81 mg by mouth at bedtime.  . clindamycin (CLEOCIN) 300 MG capsule Take 300 mg by mouth 3 (three) times daily. PRIOR TO DENTAL SURGERY  . triamcinolone cream (KENALOG) 0.1 % Apply 1 application topically 2 (two) times daily.  . VOLTAREN 1 % GEL Apply topically daily as needed.     Allergies:   Carbocaine [mepivacaine hcl], Epinephrine, Mepivacaine, Pravastatin, Procaine, Repatha [evolocumab], Rosuvastatin, Atorvastatin, Penicillins, and Sulfa antibiotics   Social History   Tobacco Use  . Smoking status: Never Smoker  .  Smokeless tobacco: Never Used  Substance Use Topics  . Alcohol use: No  . Drug use: No     Family Hx: The patient's family history includes Coronary artery disease in an other family member; Heart failure in her father; Hypertension in an other family member; Obesity in an other family member.  Review of Systems  Constitution: Positive for chills. Negative for fever.  Respiratory: Negative for cough.   Gastrointestinal: Negative for hematochezia and melena.  Genitourinary: Negative for hematuria.     EKGs/Labs/Other Test Reviewed:    EKG:  EKG is  ordered today.  The ekg ordered today demonstrates normal sinus rhythm, heart rate 66, left axis deviation, nonspecific ST-T wave  changes, low voltage, QTC 431, no change from prior tracing  Recent Labs: No results found for requested labs within last 8760 hours.   Recent Lipid Panel Lab Results  Component Value Date/Time   CHOL 237 (H) 05/19/2018 08:28 AM   TRIG 83 05/19/2018 08:28 AM   HDL 59 05/19/2018 08:28 AM   CHOLHDL 4.0 05/19/2018 08:28 AM   CHOLHDL 4.5 06/24/2016 09:11 AM   LDLCALC 161 (H) 05/19/2018 08:28 AM    Physical Exam:    VS:  BP 130/82   Pulse 66   Ht 5\' 5"  (1.651 m)   Wt 202 lb (91.6 kg)   SpO2 94%   BMI 33.61 kg/m     Wt Readings from Last 3 Encounters:  01/31/20 202 lb (91.6 kg)  10/10/19 208 lb 3.2 oz (94.4 kg)  05/19/18 199 lb (90.3 kg)     Constitutional:      Appearance: Healthy appearance. Not in distress.  Neck:     Thyroid: No thyromegaly.     Vascular: No carotid bruit. JVD normal.  Pulmonary:     Effort: Pulmonary effort is normal.     Breath sounds: No wheezing. No rales.  Cardiovascular:     Normal rate. Regular rhythm. Normal S1. Normal S2.     Murmurs: There is no murmur.  Edema:    Peripheral edema absent.  Abdominal:     Palpations: Abdomen is soft. There is no hepatomegaly.  Skin:    General: Skin is warm and dry.  Neurological:     Mental Status: Alert and oriented to person, place and time.     Cranial Nerves: Cranial nerves are intact.       ASSESSMENT & PLAN:    1. Palpitations 2. SOB (shortness of breath) 3. Near syncope 4. MVP (mitral valve prolapse) She has had several different types of symptoms recently.  She has had dizziness and near syncope as well as palpitations.  Some of her palpitations are described as a rapid heartbeat.  She has not had syncope.  She has been short of breath with certain activities.  The etiology of her symptoms are not entirely clear at this point.  She does have a prior history of mitral valve prolapse.  She has a brain MRI pending.  I do not hear any carotid bruits on exam.  However, I suspect that she may be  also getting head and neck MRA with her primary care provider.  I have suggested that we proceed with an echocardiogram to rule out structural heart disease.  I have also recommended that we obtain a 30-day event monitor to better assess her palpitations.  I will bring her back to follow-up with Dr. Angelena Form in 6 to 8 weeks, after testing is complete.  5. NICM (nonischemic cardiomyopathy) ([place])  She does have a history of nonischemic cardiomyopathy with prior EF 42 in 2009.  She had normal coronary arteries in 1998.  Given her recent symptoms, I have suggested we follow-up with an echocardiogram to reassess her LV function and rule out structural heart disease.    Dispo:  Return in about 8 weeks (around 03/27/2020) for Follow up after testing w/ Dr. Clifton James, in person.   Medication Adjustments/Labs and Tests Ordered: Current medicines are reviewed at length with the patient today.  Concerns regarding medicines are outlined above.  Tests Ordered: Orders Placed This Encounter  Procedures  . CARDIAC EVENT MONITOR  . EKG 12-Lead  . ECHOCARDIOGRAM COMPLETE   Medication Changes: No orders of the defined types were placed in this encounter.   Signed, Tereso Newcomer, PA-C  01/31/2020 5:29 PM    [facility] [address] [place], [place], [place]  [address] Phone: [phone]; Fax: [phone]

## 2020-01-31 ENCOUNTER — Ambulatory Visit (INDEPENDENT_AMBULATORY_CARE_PROVIDER_SITE_OTHER): Payer: Medicare Other | Admitting: Physician Assistant

## 2020-01-31 ENCOUNTER — Encounter: Payer: Self-pay | Admitting: Physician Assistant

## 2020-01-31 ENCOUNTER — Telehealth: Payer: Self-pay

## 2020-01-31 ENCOUNTER — Other Ambulatory Visit: Payer: Self-pay

## 2020-01-31 VITALS — BP 130/82 | HR 66 | Ht 65.0 in | Wt 202.0 lb

## 2020-01-31 DIAGNOSIS — R002 Palpitations: Secondary | ICD-10-CM | POA: Diagnosis not present

## 2020-01-31 DIAGNOSIS — R0602 Shortness of breath: Secondary | ICD-10-CM

## 2020-01-31 DIAGNOSIS — R55 Syncope and collapse: Secondary | ICD-10-CM | POA: Diagnosis not present

## 2020-01-31 DIAGNOSIS — I341 Nonrheumatic mitral (valve) prolapse: Secondary | ICD-10-CM | POA: Diagnosis not present

## 2020-01-31 DIAGNOSIS — I428 Other cardiomyopathies: Secondary | ICD-10-CM

## 2020-01-31 NOTE — Patient Instructions (Signed)
Medication Instructions:  Your physician recommends that you continue on your current medications as directed. Please refer to the Current Medication list given to you today.  *If you need a refill on your cardiac medications before your next appointment, please call your pharmacy*   Lab Work: NONE` If you have labs (blood work) drawn today and your tests are completely normal, you will receive your results only by: Marland Kitchen MyChart Message (if you have MyChart) OR . A paper copy in the mail If you have any lab test that is abnormal or we need to change your treatment, we will call you to review the results.   Testing/Procedures: Your physician has requested that you have an echocardiogram. Echocardiography is a painless test that uses sound waves to create images of your heart. It provides your doctor with information about the size and shape of your heart and how well your heart's chambers and valves are working. This procedure takes approximately one hour. There are no restrictions for this procedure.  Your physician has recommended that you wear an 30 DAY event monitor. Event monitors are medical devices that record the heart's electrical activity. Doctors most often [place] these monitors to diagnose arrhythmias. Arrhythmias are problems with the speed or rhythm of the heartbeat. The monitor is a small, portable device. You can wear one while you do your normal daily activities. This is usually used to diagnose what is causing palpitations/syncope (passing out).  Follow-Up: At [facility], you and your health needs are our priority.  As part of our continuing mission to provide you with exceptional heart care, we have created designated Provider Care Teams.  These Care Teams include your primary Cardiologist (physician) and Advanced Practice Providers (APPs -  Physician Assistants and Nurse Practitioners) who all work together to provide you with the care you need, when you need it.  We recommend  signing up for the patient portal called "MyChart".  Sign up information is provided on this After Visit Summary.  MyChart is used to connect with patients for Virtual Visits (Telemedicine).  Patients are able to view lab/test results, encounter notes, upcoming appointments, etc.  Non-urgent messages can be sent to your provider as well.   To learn more about what you can do with MyChart, go to [email or web address].    Your next appointment:   6-8 week(s)  The format for your next appointment:   In Person  Provider:   You may see Verne Carrow, MD or one of the following Advanced Practice Providers on your designated Care Team:    Ronie Spies, PA-C  Jacolyn Reedy, PA-C    Other Instructions  Echocardiogram An echocardiogram is a procedure that uses painless sound waves (ultrasound) to produce an image of the heart. Images from an echocardiogram can provide important information about:  Signs of coronary artery disease (CAD).  Aneurysm detection. An aneurysm is a weak or damaged part of an artery wall that bulges out from the normal force of blood pumping through the body.  Heart size and shape. Changes in the size or shape of the heart can be associated with certain conditions, including heart failure, aneurysm, and CAD.  Heart muscle function.  Heart valve function.  Signs of a past heart attack.  Fluid buildup around the heart.  Thickening of the heart muscle.  A tumor or infectious growth around the heart valves. Tell a health care provider about:  Any allergies you have.  All medicines you are taking, including vitamins, herbs, eye drops,  creams, and over-the-counter medicines.  Any blood disorders you have.  Any surgeries you have had.  Any medical conditions you have.  Whether you are pregnant or may be pregnant. What are the risks? Generally, this is a safe procedure. However, problems may occur, including:  Allergic reaction to dye (contrast)  that may be used during the procedure. What happens before the procedure? No specific preparation is needed. You may eat and drink normally. What happens during the procedure?   An IV tube may be inserted into one of your veins.  You may receive contrast through this tube. A contrast is an injection that improves the quality of the pictures from your heart.  A gel will be applied to your chest.  A wand-like tool (transducer) will be moved over your chest. The gel will help to transmit the sound waves from the transducer.  The sound waves will harmlessly bounce off of your heart to allow the heart images to be captured in real-time motion. The images will be recorded on a computer. The procedure may vary among health care providers and hospitals. What happens after the procedure?  You may return to your normal, everyday life, including diet, activities, and medicines, unless your health care provider tells you not to do that. Summary  An echocardiogram is a procedure that uses painless sound waves (ultrasound) to produce an image of the heart.  Images from an echocardiogram can provide important information about the size and shape of your heart, heart muscle function, heart valve function, and fluid buildup around your heart.  You do not need to do anything to prepare before this procedure. You may eat and drink normally.  After the echocardiogram is completed, you may return to your normal, everyday life, unless your health care provider tells you not to do that. This information is not intended to replace advice given to you by your health care provider. Make sure you discuss any questions you have with your health care provider. Document Revised: 12/09/2018 Document Reviewed: 09/20/2016 Elsevier Patient Education  [place].

## 2020-01-31 NOTE — Telephone Encounter (Signed)
Called pt and went over monitor instructions, verified address. 30 day Event monitor ordered to be mailed to pt.

## 2020-01-31 NOTE — Telephone Encounter (Signed)
Spoke with pt about appt with Tereso Newcomer, PA-C today at 10:15 am because there was no phone note why pt needed to be seen today when she was just seen in Feb with [place] with a 12 month f/u. Pt states she was informed by her PCP to be seen by her cardiologist due to pt having more palpitations, dizziness and being unsteady. Pt also states that she had a dental procedure earlier part of the year that caused her to have an infection and want to make sure that it didn't affect her heart. I informed to keep her appt today. Pt verbalized understanding and thanked me for the call.

## 2020-02-10 ENCOUNTER — Telehealth: Payer: Self-pay | Admitting: Cardiovascular Disease

## 2020-02-10 NOTE — Telephone Encounter (Signed)
New Message:    Pt says she still have not received her Monitor.

## 2020-02-13 NOTE — Telephone Encounter (Signed)
[employer] tracking number [id number].  According to [employer] tracking site, monitor was delivered 02/13/2020 at 1:24 PM @ front door.

## 2020-02-16 ENCOUNTER — Ambulatory Visit (INDEPENDENT_AMBULATORY_CARE_PROVIDER_SITE_OTHER): Payer: Medicare Other

## 2020-02-16 DIAGNOSIS — R55 Syncope and collapse: Secondary | ICD-10-CM | POA: Diagnosis not present

## 2020-02-16 DIAGNOSIS — R002 Palpitations: Secondary | ICD-10-CM

## 2020-02-22 ENCOUNTER — Ambulatory Visit (HOSPITAL_COMMUNITY): Payer: Medicare Other | Attending: Physician Assistant

## 2020-02-22 ENCOUNTER — Other Ambulatory Visit: Payer: Self-pay

## 2020-02-22 DIAGNOSIS — R55 Syncope and collapse: Secondary | ICD-10-CM | POA: Diagnosis not present

## 2020-02-22 DIAGNOSIS — R002 Palpitations: Secondary | ICD-10-CM | POA: Insufficient documentation

## 2020-02-22 DIAGNOSIS — R0602 Shortness of breath: Secondary | ICD-10-CM | POA: Insufficient documentation

## 2020-02-22 DIAGNOSIS — I341 Nonrheumatic mitral (valve) prolapse: Secondary | ICD-10-CM | POA: Insufficient documentation

## 2020-02-23 ENCOUNTER — Encounter: Payer: Self-pay | Admitting: Physician Assistant

## 2020-02-24 ENCOUNTER — Telehealth: Payer: Self-pay | Admitting: Cardiovascular Disease

## 2020-02-24 NOTE — Telephone Encounter (Signed)
Left message to call back  

## 2020-02-24 NOTE — Telephone Encounter (Signed)
Patient calling stating she had an echo recently and the results were good. She states her symptoms have eased and she would like to know if she still needs to wear her heart monitor.

## 2020-02-24 NOTE — Telephone Encounter (Signed)
Yes, I recommend continuing with wearing the heart monitor to best evaluate palpitations.  If symptoms have completely resolved, we can place the test on hold.  If placed on hold, she should call if symptoms recur so we can get the monitor done at that time. Tereso Newcomer, PA-C    02/24/2020 1:10 PM

## 2020-02-27 NOTE — Telephone Encounter (Signed)
Weaver, Scott T, PA-C  [facility] Triage 3 days ago     Yes, I recommend continuing with wearing the heart monitor to best evaluate palpitations.  If symptoms have completely resolved, we can place the test on hold.  If placed on hold, she should call if symptoms recur so we can get the monitor done at that time. Tereso Newcomer, PA-C    02/24/2020 1:10 PM       The patient made aware of the above. She states she is currently wearing her heart monitor and making notes of any events that occur.

## 2020-03-15 ENCOUNTER — Telehealth: Payer: Self-pay | Admitting: Cardiovascular Disease

## 2020-03-15 MED ORDER — PROPRANOLOL HCL 10 MG PO TABS: 10.0000 mg | ORAL_TABLET | Freq: Every day | ORAL | 2 refills | Status: DC | PRN

## 2020-03-15 NOTE — Telephone Encounter (Signed)
Patient returning call for monitor results. 

## 2020-03-15 NOTE — Telephone Encounter (Signed)
See monitor result notes for complete details.

## 2020-03-23 ENCOUNTER — Telehealth: Payer: Self-pay | Admitting: Cardiovascular Disease

## 2020-03-23 NOTE — Telephone Encounter (Signed)
Pt c/o of Chest Pain: STAT if CP now or developed within 24 hours  1. Are you having CP right now? no  2. Are you experiencing any other symptoms (ex. SOB, nausea, vomiting, sweating)? SOB, High HR  3. How long have you been experiencing CP? Yesterday, mid day   4. Is your CP continuous or coming and going? Came and went  5. Have you taken Nitroglycerin? No  Patient states yesterday around 12 or 1:00pm she started having chest pain while walking up a hill. She states she also had SOB and a high HR at that time. She is not having any symptoms now. She states she is not sure if it was her new medication propranolol (INDERAL) 10 MG tablet and is concerned the medication could cause her BP to drop too low. She states the medication once a day, but has not taken it today.   ?

## 2020-03-23 NOTE — Telephone Encounter (Signed)
Husband in hospital, received shoulder replacement 2 days ago.  Parked far and had to walk up hill from parking lot to hospital.  She had chest pain and SOB during this walk.  Not dizzy.  Chest pain did not radiate.  Had to sit and rest for few minutes and then it eased.  Never felt this before.  Very weak rest of day.  Today she is extremely tired.  Been under a lot of stress and having to take on a lot of extra responsibilities over last several weeks.   She has follow up with Dr. Clifton James on 7/26. I adv to minimize activity over the weekend. Go to ER for urgent eval if symptoms recur. Otherwise he will see her on Monday.  She is in agreement w plan.

## 2020-03-26 ENCOUNTER — Encounter: Payer: Self-pay | Admitting: Cardiovascular Disease

## 2020-03-26 ENCOUNTER — Ambulatory Visit (INDEPENDENT_AMBULATORY_CARE_PROVIDER_SITE_OTHER): Payer: Medicare Other | Admitting: Cardiovascular Disease

## 2020-03-26 ENCOUNTER — Encounter: Payer: Self-pay | Admitting: *Deleted

## 2020-03-26 ENCOUNTER — Other Ambulatory Visit: Payer: Self-pay

## 2020-03-26 VITALS — BP 118/76 | HR 99 | Ht 65.0 in | Wt 200.6 lb

## 2020-03-26 DIAGNOSIS — R079 Chest pain, unspecified: Secondary | ICD-10-CM

## 2020-03-26 DIAGNOSIS — I493 Ventricular premature depolarization: Secondary | ICD-10-CM | POA: Diagnosis not present

## 2020-03-26 DIAGNOSIS — R072 Precordial pain: Secondary | ICD-10-CM | POA: Diagnosis not present

## 2020-03-26 DIAGNOSIS — I428 Other cardiomyopathies: Secondary | ICD-10-CM

## 2020-03-26 MED ORDER — METOPROLOL SUCCINATE ER 25 MG PO TB24
25.0000 mg | ORAL_TABLET | Freq: Every day | ORAL | 3 refills | Status: DC
Start: 2020-03-26 — End: 2020-05-30

## 2020-03-26 NOTE — Progress Notes (Signed)
Chief Complaint  Patient presents with  . Follow-up    chest pain   History of Present Illness: 74 yo female with history of palpitations, mitral valve prolapse and HLD who is here today for follow up. I saw her once in May 2013 as a new patient for evaluation of palpitations and decreased LV systolic function. Normal cardiac cath at [facility]. Echo 2010 with LVEF 50%. Septal hypertrophy, mild. Mild MR. Moderate LAE. Myoview 2009 with LVEF 42%. I ordered an echo at her first visit here in 2013. This showed normal LV systolic function with LVEF=60%. No mitral regurgitation. She was told in the past she had mitral valve prolapse. She has not tolerated statins. She had been followed by a cardiologist in [place], [place] and she feels that her legs still hurt because of the statin that he had prescribed. She was seen in October 2017 in our office as she was not happy with what she was told in that office. She described occasional chest pressure while at rest, rare palpitations and some LE edema.  Echo 06/23/16 with normal LV systolic function, grade 2 diastolic dysfunction, no significant valve disease. Nuclear stress test October 2017 with no ischemia. She has not tolerated statins or Repatha due to muscle aches. Her insurance does not cover Nexletol. She was seen by Tereso Newcomer, PA in June 2021 for palpitations, dyspnea, dizziness and near syncope.Echo June 2021 with  Normal LVEF, MVP with no MR. 30 day cardiac monitor showed PACs and PVCs. She was given propranolol to use as needed.   She is here today for follow up. The patient denies any lower extremity edema, orthopnea, PND, dizziness, near syncope or syncope. She describes chest pain and dyspnea with exertion. This happened when walking up a hill last week.   Primary Care Physician: System, Pcp Not In [facility] [place], [place])  Past Medical History:  Diagnosis Date  . Dependent edema   . Diastolic dysfunction   . Hyperlipidemia      mixed  . Mitral valve prolapse    Echocardiogram 01/2020: EF 56, no RWMA, Gr 1 DD, GLS -21.3, normal RVSF, mild bileaflet MVP (myxomatous), trivial MR, Ao root 37  . NICM (nonischemic cardiomyopathy) (HCC)    a. nuclear stress test in 2009 with LVEF 42%, and echo in 2010 with EF 50%. b. subsequent echoes with normal LVEF.  . 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 Stress incontinence     Past Surgical History:  Procedure Laterality Date  . ABDOMINAL HYSTERECTOMY    . CARDIAC CATHETERIZATION     1998 [facility] "CAD"  . OVARIAN CYST REMOVAL      Current Outpatient Medications  Medication Sig Dispense Refill  . aspirin EC 81 MG tablet Take 81 mg by mouth at bedtime.    . clindamycin (CLEOCIN) 300 MG capsule Take 300 mg by mouth 3 (three) times daily. PRIOR TO DENTAL SURGERY    . propranolol (INDERAL) 10 MG tablet Take 1 tablet (10 mg total) by mouth daily as needed. 10 tablet 2  . triamcinolone cream (KENALOG) 0.1 % Apply 1 application topically 2 (two) times daily.    . VOLTAREN 1 % GEL Apply topically daily as needed.    . metoprolol succinate (TOPROL XL) 25 MG 24 hr tablet Take 1 tablet (25 mg total) by mouth daily. 90 tablet 3   No current facility-administered medications for this visit.    Allergies  Allergen Reactions  .  Carbocaine [Mepivacaine Hcl]     Anaphylaxis- pt stopped breathing  . Epinephrine Other (See Comments)    "extremem rapid heart beat"  . Mepivacaine Anaphylaxis  . Pravastatin     Extreme pain in joints  . Procaine Other (See Comments)    Irregular heartbeat  . Repatha [Evolocumab]     Myalgias and joint pain  . Rosuvastatin     Myalgias on twice weekly crestor  . Atorvastatin   . Penicillins Other (See Comments)    Weakness, vision problems, dyspnea, sweating  . Sulfa Antibiotics Other (See Comments)    Topical cream applied - made her rash worse where she applied it.    Social History   Socioeconomic History  . Marital status: Married     Spouse name: Not on file  . Number of children: 3  . Years of education: Not on file  . Highest education level: Not on file  Occupational History  . Occupation: Surveyor, minerals  Tobacco Use  . Smoking status: Never Smoker  . Smokeless tobacco: Never Used  Vaping Use  . Vaping Use: Never used  Substance and Sexual Activity  . Alcohol use: No  . Drug use: No  . Sexual activity: Not on file  Other Topics Concern  . Not on file  Social History Narrative  . Not on file   Social Determinants of Health   Financial Resource Strain:   . Difficulty of Paying Living Expenses:   Food Insecurity:   . Worried About Programme researcher, broadcasting/film/video in the Last Year:   . Barista in the Last Year:   Transportation Needs:   . Freight forwarder (Medical):   Marland Kitchen Lack of Transportation (Non-Medical):   Physical Activity:   . Days of Exercise per Week:   . Minutes of Exercise per Session:   Stress:   . Feeling of Stress :   Social Connections:   . Frequency of Communication with Friends and Family:   . Frequency of Social Gatherings with Friends and Family:   . Attends Religious Services:   . Active Member of Clubs or Organizations:   . Attends Banker Meetings:   Marland Kitchen Marital Status:   Intimate Partner Violence:   . Fear of Current or Ex-Partner:   . Emotionally Abused:   Marland Kitchen Physically Abused:   . Sexually Abused:     Family History  Problem Relation Age of Onset  . Coronary artery disease Other   . Obesity Other   . Hypertension Other   . Heart failure Father     Review of Systems:  As stated in the HPI and otherwise negative.   BP 118/76   Pulse 99   Ht 5\' 5"  (1.651 m)   Wt 200 lb 9.6 oz (91 kg)   SpO2 92%   BMI 33.38 kg/m   Physical Examination:  General: Well developed, well nourished, NAD  HEENT: OP clear, mucus membranes moist  SKIN: warm, dry. No rashes. Neuro: No focal deficits  Musculoskeletal: Muscle strength 5/5 all ext  Psychiatric: Mood and affect  normal  Neck: No JVD, no carotid bruits, no thyromegaly, no lymphadenopathy.  Lungs:Clear bilaterally, no wheezes, rhonci, crackles Cardiovascular: Regular rate and rhythm. No murmurs, gallops or rubs. Abdomen:Soft. Bowel sounds present. Non-tender.  Extremities: No lower extremity edema. Pulses are 2 + in the bilateral DP/PT.  Echo June 2021:  1. Left ventricular ejection fraction by 3D volume is 56 %. The left  ventricle has normal  function. The left ventricle has no regional wall  motion abnormalities. Left ventricular diastolic parameters are consistent  with Grade I diastolic dysfunction  (impaired relaxation). The average left ventricular global longitudinal  strain is -21.3 %. The global longitudinal strain is normal.  2. Right ventricular systolic function is normal. The right ventricular  size is normal. Tricuspid regurgitation signal is inadequate for assessing  PA pressure.  3. Mild bileaflet mitral valve prolapse. The mitral valve is myxomatous.  Trivial mitral valve regurgitation. No evidence of mitral stenosis.  4. The aortic valve is tricuspid. Aortic valve regurgitation is not  visualized. No aortic stenosis is present.  5. Aortic dilatation noted. There is borderline dilatation of the aortic  root measuring 37 mm.  6. The inferior vena cava is normal in size with greater than 50%  respiratory variability, suggesting right atrial pressure of 3 mmHg.   Nuclear stress test 06/24/16:  Nuclear stress EF: 66%.  Blood pressure demonstrated a normal response to exercise.  There was no ST segment deviation noted during stress.  Defect 1: There is a small defect of severe severity present in the apex location.  This is a low risk study.  The left ventricular ejection fraction is hyperdynamic (>65%).   Low risk stress nuclear study with chest pain but images show apical thinning and no ischemia; EF 66 with normal wall motion. .  EKG:  EKG is not ordered  today. The ekg ordered today demonstrates   Recent Labs: No results found for requested labs within last 8760 hours.   Lipid Panel    Component Value Date/Time   CHOL 237 (H) 05/19/2018 0828   TRIG 83 05/19/2018 0828   HDL 59 05/19/2018 0828   CHOLHDL 4.0 05/19/2018 0828   CHOLHDL 4.5 06/24/2016 0911   VLDL 18 06/24/2016 0911   LDLCALC 161 (H) 05/19/2018 0828     Wt Readings from Last 3 Encounters:  03/26/20 200 lb 9.6 oz (91 kg)  01/31/20 202 lb (91.6 kg)  10/10/19 208 lb 3.2 oz (94.4 kg)      Assessment and Plan:   1. Chest pain: Her pain was exertional in the setting of heavy exertion. No known CAD. Will arrange a nuclear stress test to exclude ischemia  2. Non-ischemic cardiomyopathy: Normal LV function by echo June 2021  3. Hyperlipidemia: Statin intolerant. She also did not tolerate Repatha. Her lipids are followed in primary care.   4. PVCs/PACs: Will change propranolol to Toprol 25 mg daily  Current medicines are reviewed at length with the patient today.  The patient does not have concerns regarding medicines.  The following changes have been made:  no change  Labs/ tests ordered today include:   Orders Placed This Encounter  Procedures  . MYOCARDIAL PERFUSION IMAGING   Disposition:   FU with me in 2 months  Signed, Verne Carrow, MD 03/26/2020 2:42 PM    [facility] [address] [place], [place], [place]  [address] Phone: [phone]; Fax: [phone]

## 2020-03-26 NOTE — Telephone Encounter (Signed)
Thanks

## 2020-03-26 NOTE — Patient Instructions (Addendum)
Medication Instructions:  Your physician has recommended you make the following change in your medication:   1.) stop propranolol 2.) start Toprol XL 25 mg daily  *If you need a refill on your cardiac medications before your next appointment, please call your pharmacy*   Lab Work: none If you have labs (blood work) drawn today and your tests are completely normal, you will receive your results only by: Marland Kitchen MyChart Message (if you have MyChart) OR . A paper copy in the mail If you have any lab test that is abnormal or we need to change your treatment, we will call you to review the results.   Testing/Procedures: Your physician has requested that you have a lexiscan myoview. For further information please visit [email or web address] Please follow instruction sheet, as given.   Follow-Up: At [facility], you and your health needs are our priority.  As part of our continuing mission to provide you with exceptional heart care, we have created designated Provider Care Teams.  These Care Teams include your primary Cardiologist (physician) and Advanced Practice Providers (APPs -  Physician Assistants and Nurse Practitioners) who all work together to provide you with the care you need, when you need it.  We recommend signing up for the patient portal called "MyChart".  Sign up information is provided on this After Visit Summary.  MyChart is used to connect with patients for Virtual Visits (Telemedicine).  Patients are able to view lab/test results, encounter notes, upcoming appointments, etc.  Non-urgent messages can be sent to your provider as well.   To learn more about what you can do with MyChart, go to [email or web address].    Your next appointment:   2 month(s)  The format for your next appointment:   In Person  Provider:   You may see Verne Carrow, MD or one of the following Advanced Practice Providers on your designated Care Team:    Ronie Spies, PA-C  Jacolyn Reedy,  PA-C    Other Instructions

## 2020-04-02 ENCOUNTER — Telehealth (HOSPITAL_COMMUNITY): Payer: Self-pay | Admitting: *Deleted

## 2020-04-02 NOTE — Telephone Encounter (Signed)
Patient given detailed instructions per Myocardial Perfusion Study Information Sheet for the test on  04/02/20. Patient notified to arrive 15 minutes early and that it is imperative to arrive on time for appointment to keep from having the test rescheduled.  If you need to cancel or reschedule your appointment, please call the office within 24 hours of your appointment. . Patient verbalized understanding. Sabrina Chambers    

## 2020-04-04 ENCOUNTER — Ambulatory Visit (HOSPITAL_COMMUNITY): Payer: Medicare Other | Attending: Internal Medicine

## 2020-04-04 ENCOUNTER — Other Ambulatory Visit: Payer: Self-pay

## 2020-04-04 DIAGNOSIS — R072 Precordial pain: Secondary | ICD-10-CM

## 2020-04-04 LAB — MYOCARDIAL PERFUSION IMAGING
LV dias vol: 75 mL (ref 46–106)
LV sys vol: 36 mL
Peak HR: 100 {beats}/min
Rest HR: 60 {beats}/min
SDS: 2
SRS: 0
SSS: 2
TID: 0.98

## 2020-04-04 MED ORDER — TECHNETIUM TC 99M TETROFOSMIN IV KIT
31.5000 | PACK | Freq: Once | INTRAVENOUS | Status: AC | PRN
  Filled 2020-04-04: qty 32

## 2020-04-04 MED ORDER — REGADENOSON 0.4 MG/5ML IV SOLN
0.4000 mg | Freq: Once | INTRAVENOUS | Status: AC
Start: 2020-04-04 — End: ?

## 2020-04-04 MED ORDER — TECHNETIUM TC 99M TETROFOSMIN IV KIT
10.4000 | PACK | Freq: Once | INTRAVENOUS | Status: AC | PRN
  Filled 2020-04-04: qty 11

## 2020-04-20 ENCOUNTER — Telehealth: Payer: Self-pay | Admitting: Cardiovascular Disease

## 2020-04-20 NOTE — Telephone Encounter (Signed)
The patient wanted to know if she should start the long acting Toprol XL 25 mg that was prescribed at last ov by Dr. Clifton James: 4. PVCs/PACs: Will change propranolol to Toprol 25 mg daily  She will stop taking propranolol and beging Toprol 25 mg daily in the morning since she continues to have episodes of the short bursts of rapid HRs.

## 2020-04-20 NOTE — Telephone Encounter (Signed)
Pt c/o medication issue:  1. Name of Medication: metoprolol succinate (TOPROL XL) 25 MG 24 hr tablet  2. How are you currently taking this medication (dosage and times per day)? 1 tablet by mouth daily of 10 MG tablet   3. Are you having a reaction (difficulty breathing--STAT)? No  4. What is your medication issue? Sabrina Chambers is calling wanting to know if based upon the test results she has recently had performed if Dr. Clifton James would still like her to increase to 25 MG's. She states she is still currently taking 10 MG's per day, but she has already picked up the 25 MG prescription from the pharmacy. She is requesting Sabrina Chambers calls her back in regards to this. Please advise.

## 2020-05-28 ENCOUNTER — Telehealth: Payer: Self-pay | Admitting: Cardiovascular Disease

## 2020-05-28 NOTE — Telephone Encounter (Signed)
Pt c/o medication issue:  1. Name of Medication: metoprolol succinate (TOPROL XL) 25 MG 24 hr tablet  2. How are you currently taking this medication (dosage and times per day)? 1 tablet a day  3. Are you having a reaction (difficulty breathing--STAT)? no  4. What is your medication issue? Patient states the medication made her very dizzy, unsteady on her feet, and fatigued. She states she is concerned about falling. She states she did not have any problems on the 10 mg and would like to know if she could go on that twice a day instead.

## 2020-05-28 NOTE — Telephone Encounter (Signed)
She reports she did not do well on Toprol XL 25 - felt wobbly and dizzy as well as fatigued.  She took for a few days and then stopped.   She doesn't remember if she took in AM or PM.   Has follow up with Leda Gauze on 9/29.  Will come in to discuss options.  She wonders if she can go back on propranolol 10 mg and take more than once a day because it seemed to help during the day but wore off.

## 2020-05-29 NOTE — Progress Notes (Signed)
Cardiology Office Note    Date:  05/30/2020   ID:  Sabrina, Chambers 1945/10/12, MRN [id number]  PCP:  Pcp, No  Cardiologist: Verne Carrow, MD EPS: None  Chief Complaint  Patient presents with  . Follow-up    History of Present Illness:  Sabrina Chambers is a 74 y.o. female with history of palpitations, MVP, HLD.  She had normal cardiac cath at [facility].  History of decreased LV function echo in 2010 EF 50% with septal hypertrophy mild MR.  Myoview 2009 EF 42% echo in 2013 normal systolic function EF 60% no MR.  Echo in [facility] normal systolic function grade 2 DD NST 06/2016 no ischemia.  Intolerant to statins or Repatha due to muscle aches.  Echo 01/2020 normal LVEF, MVP with no MR.  30-day monitor PACs and PVCs she was given as needed propanolol.  Patient saw Dr. Clifton James 03/26/2020 complaning of exertional chest pain.  He change propanolol to Toprol XL 25 mg once daily.  She called in saying she could not tolerate this and wanted to go back on propanolol.  Lexiscan Myoview 04/04/2020 normal.  Patient denies chest pain. Does have palpitations if she's very busy, extra tired. Drinks decaf coffee. Not getting a lot of exercise because of arthritis. Takes care of her grandchildren    Past Medical History:  Diagnosis Date  . Dependent edema   . Diastolic dysfunction   . Hyperlipidemia    mixed  . Mitral valve prolapse    Echocardiogram 01/2020: EF 56, no RWMA, Gr 1 DD, GLS -21.3, normal RVSF, mild bileaflet MVP (myxomatous), trivial MR, Ao root 37  . NICM (nonischemic cardiomyopathy) (HCC)    a. nuclear stress test in 2009 with LVEF 42%, and echo in 2010 with EF 50%. b. subsequent echoes with normal LVEF.  . 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 Stress incontinence     Past Surgical History:  Procedure Laterality Date  . ABDOMINAL HYSTERECTOMY    . CARDIAC CATHETERIZATION     1998 [facility] "CAD"  . OVARIAN CYST REMOVAL      Current Medications: Current Meds  Medication Sig   . aspirin EC 81 MG tablet Take 81 mg by mouth at bedtime.  . clindamycin (CLEOCIN) 300 MG capsule Take 300 mg by mouth 3 (three) times daily. PRIOR TO DENTAL SURGERY  . propranolol (INDERAL) 10 MG tablet Take 10mg  tablet 3-4 times daily as needed  . triamcinolone cream (KENALOG) 0.1 % Apply 1 application topically 2 (two) times daily.  . VOLTAREN 1 % GEL Apply topically daily as needed.  . [DISCONTINUED] propranolol (INDERAL) 10 MG tablet Take 1 tablet (10 mg total) by mouth daily as needed.     Allergies:   Carbocaine [mepivacaine hcl], Epinephrine, Mepivacaine, Pravastatin, Procaine, Repatha [evolocumab], Rosuvastatin, Atorvastatin, Penicillins, and Sulfa antibiotics   Social History   Socioeconomic History  . Marital status: Married    Spouse name: Not on file  . Number of children: 3  . Years of education: Not on file  . Highest education level: Not on file  Occupational History  . Occupation:  Tobacco Use  . Smoking status: Never Smoker  . Smokeless tobacco: Never Used  Vaping Use  . Vaping Use: Never used  Substance and Sexual Activity  . Alcohol use: No  . Drug use: No  . Sexual activity: Not on file  Other Topics Concern  . Not on file  Social History Narrative  . Not  on file   Social Determinants of Health   Financial Resource Strain:   . Difficulty of Paying Living Expenses: Not on file  Food Insecurity:   . Worried About Programme researcher, broadcasting/film/video in the Last Year: Not on file  . Ran Out of Food in the Last Year: Not on file  Transportation Needs:   . Lack of Transportation (Medical): Not on file  . Lack of Transportation (Non-Medical): Not on file  Physical Activity:   . Days of Exercise per Week: Not on file  . Minutes of Exercise per Session: Not on file  Stress:   . Feeling of Stress : Not on file  Social Connections:   . Frequency of Communication with Friends and Family: Not on file  . Frequency of Social Gatherings with Friends and Family:  Not on file  . Attends Religious Services: Not on file  . Active Member of Clubs or Organizations: Not on file  . Attends Banker Meetings: Not on file  . Marital Status: Not on file     Family History:  The patient's  family history includes Coronary artery disease in an other family member; Heart failure in her father; Hypertension in an other family member; Obesity in an other family member.   ROS:   Please see the history of present illness.    ROS All other systems reviewed and are negative.   PHYSICAL EXAM:   VS:  BP 114/82   Pulse 89   Ht 5\' 5"  (1.651 m)   Wt 202 lb 6.4 oz (91.8 kg)   SpO2 96%   BMI 33.68 kg/m   Physical Exam  GEN: Obese, in no acute distress  Neck: no JVD, carotid bruits, or masses Cardiac:RRR; no murmurs, rubs, or gallops  Respiratory:  clear to auscultation bilaterally, normal work of breathing GI: soft, nontender, nondistended, + BS Ext: without cyanosis, clubbing, or edema, Good distal pulses bilaterally Neuro:  Alert and Oriented x 3 Psych: euthymic mood, full affect  Wt Readings from Last 3 Encounters:  05/30/20 202 lb 6.4 oz (91.8 kg)  04/04/20 200 lb (90.7 kg)  03/26/20 200 lb 9.6 oz (91 kg)      Studies/Labs Reviewed:   EKG:  EKG is not ordered today.    Recent Labs: No results found for requested labs within last 8760 hours.   Lipid Panel    Component Value Date/Time   CHOL 237 (H) 05/19/2018 0828   TRIG 83 05/19/2018 0828   HDL 59 05/19/2018 0828   CHOLHDL 4.0 05/19/2018 0828   CHOLHDL 4.5 06/24/2016 0911   VLDL 18 06/24/2016 0911   LDLCALC 161 (H) 05/19/2018 0828    Additional studies/ records that were reviewed today include:  Lexiscan Myoview 04/04/2020   Study Highlights     Nuclear stress EF: 52%.  There was no ST segment deviation noted during stress.  The left ventricular ejection fraction is mildly decreased (45-54%).  This is a low risk study.   No ischemia or infarction on perfusion  images. Normal wall motion. EF calculated at 52%, visually appears 60%.      ASSESSMENT:    1. History of chest pain   2. NICM (nonischemic cardiomyopathy) (HCC)   3. Palpitations   4. Hyperlipidemia, unspecified hyperlipidemia type   5. Flu vaccine need      PLAN:  In order of problems listed above:  History of exertional chest pain NST normal 04/04/2020  History of nonischemic cardiomyopathy normal LV function  on echo 01/2020  Palpitations with PVCs and PACs she did not tolerate Toprol wants to switch back to propanolol  Hyperlipidemia statin intolerant and did not tolerate Repatha.  Lipids managed by primary care  Flu vaccine given  Medication Adjustments/Labs and Tests Ordered: Current medicines are reviewed at length with the patient today.  Concerns regarding medicines are outlined above.  Medication changes, Labs and Tests ordered today are listed in the Patient Instructions below. Patient Instructions  Medication Instructions:  Your physician has recommended you make the following change in your medication:   INCREASE Propranolol 10mg  3-4 times daily as needed  *If you need a refill on your cardiac medications before your next appointment, please call your pharmacy*   Lab Work: None If you have labs (blood work) drawn today and your tests are completely normal, you will receive your results only by: MyChart Message (if you have MyChart) OR . A paper copy in the mail If you have any lab test that is abnormal or we need to change your treatment, we will call you to review the results.   Testing/Procedures: None   Follow-Up: At [facility], you and your health needs are our priority.  As part of our continuing mission to provide you with exceptional heart care, we have created designated Provider Care Teams.  These Care Teams include your primary Cardiologist (physician) and Advanced Practice Providers (APPs -  Physician Assistants and Nurse Practitioners)  who all work together to provide you with the care you need, when you need it.  We recommend signing up for the patient portal called "MyChart".  Sign up information is provided on this After Visit Summary.  MyChart is used to connect with patients for Virtual Visits (Telemedicine).  Patients are able to view lab/test results, encounter notes, upcoming appointments, etc.  Non-urgent messages can be sent to your provider as well.   To learn more about what you can do with MyChart, go to [facility].    Your next appointment:   1 year(s)  The format for your next appointment:   In Person  Provider:   [email or web address], MD   Other Instructions None     Signed, Verne Carrow, PA-C  05/30/2020 1:14 PM    [facility] [address] [place], [place], [place]  [place] Phone: [phone]; Fax: [phone]

## 2020-05-30 ENCOUNTER — Other Ambulatory Visit: Payer: Self-pay

## 2020-05-30 ENCOUNTER — Encounter: Payer: Self-pay | Admitting: Physician Assistant

## 2020-05-30 ENCOUNTER — Ambulatory Visit (INDEPENDENT_AMBULATORY_CARE_PROVIDER_SITE_OTHER): Payer: Medicare Other | Admitting: Physician Assistant

## 2020-05-30 VITALS — BP 114/82 | HR 89 | Ht 65.0 in | Wt 202.4 lb

## 2020-05-30 DIAGNOSIS — E785 Hyperlipidemia, unspecified: Secondary | ICD-10-CM

## 2020-05-30 DIAGNOSIS — Z87898 Personal history of other specified conditions: Secondary | ICD-10-CM

## 2020-05-30 DIAGNOSIS — R002 Palpitations: Secondary | ICD-10-CM | POA: Diagnosis not present

## 2020-05-30 DIAGNOSIS — I428 Other cardiomyopathies: Secondary | ICD-10-CM

## 2020-05-30 DIAGNOSIS — Z23 Encounter for immunization: Secondary | ICD-10-CM

## 2020-05-30 MED ORDER — PROPRANOLOL HCL 10 MG PO TABS: ORAL_TABLET | ORAL | 2 refills | Status: DC

## 2020-05-30 NOTE — Patient Instructions (Signed)
Medication Instructions:  Your physician has recommended you make the following change in your medication:   INCREASE Propranolol 10mg  3-4 times daily as needed  *If you need a refill on your cardiac medications before your next appointment, please call your pharmacy*   Lab Work: None If you have labs (blood work) drawn today and your tests are completely normal, you will receive your results only by: MyChart Message (if you have MyChart) OR . A paper copy in the mail If you have any lab test that is abnormal or we need to change your treatment, we will call you to review the results.   Testing/Procedures: None   Follow-Up: At [facility], you and your health needs are our priority.  As part of our continuing mission to provide you with exceptional heart care, we have created designated Provider Care Teams.  These Care Teams include your primary Cardiologist (physician) and Advanced Practice Providers (APPs -  Physician Assistants and Nurse Practitioners) who all work together to provide you with the care you need, when you need it.  We recommend signing up for the patient portal called "MyChart".  Sign up information is provided on this After Visit Summary.  MyChart is used to connect with patients for Virtual Visits (Telemedicine).  Patients are able to view lab/test results, encounter notes, upcoming appointments, etc.  Non-urgent messages can be sent to your provider as well.   To learn more about what you can do with MyChart, go to [facility].    Your next appointment:   1 year(s)  The format for your next appointment:   In Person  Provider:   [email or web address], MD   Other Instructions None

## 2020-09-10 ENCOUNTER — Other Ambulatory Visit: Payer: Self-pay | Admitting: Physician Assistant

## 2020-11-28 ENCOUNTER — Other Ambulatory Visit: Payer: Self-pay | Admitting: Physician Assistant

## 2021-05-28 NOTE — Progress Notes (Signed)
Cardiology Office Note    Date:  05/30/2021   ID:  Sabrina, Chambers [birth year], MRN [id number]  PCP:  Pcp, No  Cardiologist:  Verne Carrow, MD  Electrophysiologist:  None   Chief Complaint: f/u PVCs, MVP  History of Present Illness:   Sabrina Chambers is a 75 y.o. female with history of palpitations, mitral valve prolapse, HLD, possible nonischemic cardiomyopathy, diastolic dysfunction, chest pain who is here today for follow-up. Dr. Gibson Ramp notes reviewed.  He saw her in May 2013 as a new patient for evaluation of palpitations and prior history of decreased LV systolic function. His prior notes referenced a normal cath in 1998 and nuclear stress test in 2009 with LVEF 42%. 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 She has a history of dependent edema but has not wished to use Lasix. Regarding HLD, she has had prior statin intolerance and Repatha due to muscle aches. She previously stopped metoprolol due to intolerance.  She is seen back for follow-up overall doing well. About a month ago she ran out of propranolol so resumed her prior home Toprol 25mg  daily. She states it took a few days to get acclimated but actually feels she's doing quite well on this. She still has occasional breakthrough palpitations that last less than a minute. She also reported that the brief intermittent dizziness she's had for 3 years seems to be doing better as well. No CP reported. She ate a full breakfast today. She does report following with primary care after her initial Covid vaccines for intense headaches with reassuring workup otherwise (reports brain imaging, Lyme testing, etc). She lives in [facility].   Labwork independently reviewed: 2021 CK wnl 2019 LDL 161 2018 Cr  0.73   Past Medical History:  Diagnosis Date   Dependent edema    Diastolic dysfunction    Hyperlipidemia    mixed   Mitral valve prolapse    Echocardiogram 01/2020: EF 56, no RWMA, Gr 1 DD, GLS -21.3, normal RVSF, mild bileaflet MVP (myxomatous), trivial MR, Ao root 37   NICM (nonischemic cardiomyopathy) (HCC)    a. nuclear stress test in 2009 with LVEF 42%, and echo in 2010 with EF 50%. b. subsequent echoes with normal LVEF.   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   ALPRAZolam (XANAX) 0.5 MG tablet Take 0.5 mg by mouth 2 (two) times daily.   aspirin EC 81 MG tablet Take 81 mg by mouth at bedtime.   B Complex Vitamins (RA B-COMPLEX WITH B-12) TABS    Cholecalciferol 10 MCG (400 UNIT) CAPS    clindamycin (CLEOCIN) 300 MG capsule Take 300 mg by mouth 3 (three) times daily. PRIOR TO DENTAL SURGERY   diclofenac Sodium (VOLTAREN) 1 % GEL Apply 2 g topically 3 (three) times daily.   Iron-Vit C-Vit B12-Folic Acid 100-250-0.025-1 MG TABS Take 1 tablet by mouth daily.   propranolol (  INDERAL) 10 MG tablet TAKE 10MG  TABLET 3-4 TIMES DAILY AS NEEDED   sertraline (ZOLOFT) 25 MG tablet Take 25 mg by mouth daily.   triamcinolone cream (KENALOG) 0.1 % Apply 1 application topically 2 (two) times daily.   VOLTAREN 1 % GEL Apply topically daily as needed.      Allergies:   Carbocaine [mepivacaine hcl], Epinephrine, Mepivacaine, Pravastatin, Procaine, Repatha [evolocumab], Rosuvastatin, Atorvastatin, Penicillins, and Sulfa antibiotics   Social History   Socioeconomic History   Marital status: Married    Spouse name: Not on file   Number of children: 3   Years of education: Not on file    Highest education level: Not on file  Occupational History   Occupation:  Tobacco Use   Smoking status: Never   Smokeless tobacco: Never  Vaping Use   Vaping Use: Never used  Substance and Sexual Activity   Alcohol use: No   Drug use: No   Sexual activity: Not on file  Other Topics Concern   Not on file  Social History Narrative   Not on file   Social Determinants of Health   Financial Resource Strain: Not on file  Food Insecurity: Not on file  Transportation Needs: Not on file  Physical Activity: Not on file  Stress: Not on file  Social Connections: Not on file     Family History:  The patient's family history includes Coronary artery disease in an other family member; Heart failure in her father; Hypertension in an other family member; Obesity in an other family member.  ROS:   Please see the history of present illness.  All other systems are reviewed and otherwise negative.    EKGs/Labs/Other Studies Reviewed:    Studies reviewed are outlined and summarized above. Reports included below if pertinent.  Nuclear stress test 04/2020 Nuclear stress EF: 52%. There was no ST segment deviation noted during stress. The left ventricular ejection fraction is mildly decreased (45-54%). This is a low risk study.   No ischemia or infarction on perfusion images. Normal wall motion. EF calculated at 52%, visually appears 60%.  Event monitor 03/2020 Sinus rhythm Rare premature atrial contractions Rare premature ventricular contractions  2D echo 01/2020   1. Left ventricular ejection fraction by 3D volume is 56 %. The left  ventricle has normal function. The left ventricle has no regional wall  motion abnormalities. Left ventricular diastolic parameters are consistent  with Grade I diastolic dysfunction  (impaired relaxation). The average left ventricular global longitudinal  strain is -21.3 %. The global longitudinal strain is normal.   2. Right ventricular  systolic function is normal. The right ventricular  size is normal. Tricuspid regurgitation signal is inadequate for assessing  PA pressure.   3. Mild bileaflet mitral valve prolapse. The mitral valve is myxomatous.  Trivial mitral valve regurgitation. No evidence of mitral stenosis.   4. The aortic valve is tricuspid. Aortic valve regurgitation is not  visualized. No aortic stenosis is present.   5. Aortic dilatation noted. There is borderline dilatation of the aortic  root measuring 37 mm.   6. The inferior vena cava is normal in size with greater than 50%  respiratory variability, suggesting right atrial pressure of 3 mmHg.   Comparison(s): 06/23/16 EF 50-55%.     EKG:  EKG is ordered today, personally reviewed, demonstrating SB 58bpm, LAD, poor R wave progression, nonspecific STTW changes. No significant change from prior.  Recent Labs: No results found for requested labs within last 8760 hours.  Recent Lipid Panel    Component Value Date/Time   CHOL 237 (H) 05/19/2018 0828   TRIG 83 05/19/2018 0828   HDL 59 05/19/2018 0828   CHOLHDL 4.0 05/19/2018 0828   CHOLHDL 4.5 06/24/2016 0911   VLDL 18 06/24/2016 0911   LDLCALC 161 (H) 05/19/2018 0828    PHYSICAL EXAM:    VS:  BP 116/82   Pulse (!) 58   Ht 5\' 5"  (1.651 m)   Wt 191 lb 3.2 oz (86.7 kg)   SpO2 95%   BMI 31.82 kg/m   BMI: Body mass index is 31.82 kg/m.  GEN: Well nourished, well developed female in no acute distress HEENT: normocephalic, atraumatic Neck: no JVD, carotid bruits, or masses Cardiac: RRR; no murmurs, rubs, or gallops, no edema  Respiratory:  clear to auscultation bilaterally, normal work of breathing GI: soft, nontender, nondistended, + BS MS: no deformity or atrophy Skin: warm and dry, no rash Neuro:  Alert and Oriented x 3, Strength and sensation are intact, follows commands Psych: euthymic mood, full affect  Wt Readings from Last 3 Encounters:  05/30/21 191 lb 3.2 oz (86.7 kg)  05/30/20  202 lb 6.4 oz (91.8 kg)  04/04/20 200 lb (90.7 kg)     ASSESSMENT & PLAN:   1. Palpitations with history of PACs/PVCs - doing better on Toprol at this time, therefore will continue. Will remove propranolol from med list (she feels she's been doing better with a proactive approach rather than reactive). She has not had any recent bloodwork in our system. She would like to pursue checking cholesterol so we will have her return for her labs on a day when she is fasting - at that time we'll update her CBC, lytes, and thyroid.  2. Mitral valve prolapse with borderline dilation of aorta by last echo - given borderline dilation of aorta by last echo over a year ago, we will repeat echocardiogram. No murmur on exam. No CP, syncope, HF symptoms.  3. Hyperlipidemia - when she returns for her echo, we will obtain CMET and lipid profile. She has been working on lifestyle changes and lost weight. We may consider referring back to lipid clinic/Dr. Hilty to consider alternative treatments aside from what she's tried.  4. Prior NICM - no recent HF symptoms. We will reassess LVEF by echocardiogram as above. Continue Toprol. Otherwise would plan to hold off ACEI/ARB at present time given tendency for low/normal BP and prior dizziness.  Disposition: F/u with Dr. 06/04/20 in 1 year.  Medication Adjustments/Labs and Tests Ordered: Current medicines are reviewed at length with the patient today.  Concerns regarding medicines are outlined above. Medication changes, Labs and Tests ordered today are summarized above and listed in the Patient Instructions accessible in Encounters.   Signed, Clifton James, PA-C  05/30/2021 10:57 AM    [facility] [address] [place], [place], [place]  [place] Phone: [phone]; Fax: [phone]

## 2021-05-30 ENCOUNTER — Other Ambulatory Visit: Payer: Self-pay

## 2021-05-30 ENCOUNTER — Ambulatory Visit (INDEPENDENT_AMBULATORY_CARE_PROVIDER_SITE_OTHER): Payer: Medicare Other | Admitting: Physician Assistant

## 2021-05-30 ENCOUNTER — Encounter: Payer: Self-pay | Admitting: Physician Assistant

## 2021-05-30 VITALS — BP 116/82 | HR 58 | Ht 65.0 in | Wt 191.2 lb

## 2021-05-30 DIAGNOSIS — Z23 Encounter for immunization: Secondary | ICD-10-CM | POA: Diagnosis not present

## 2021-05-30 DIAGNOSIS — I77819 Aortic ectasia, unspecified site: Secondary | ICD-10-CM

## 2021-05-30 DIAGNOSIS — I428 Other cardiomyopathies: Secondary | ICD-10-CM

## 2021-05-30 DIAGNOSIS — I341 Nonrheumatic mitral (valve) prolapse: Secondary | ICD-10-CM | POA: Diagnosis not present

## 2021-05-30 DIAGNOSIS — E785 Hyperlipidemia, unspecified: Secondary | ICD-10-CM

## 2021-05-30 DIAGNOSIS — R002 Palpitations: Secondary | ICD-10-CM

## 2021-05-30 DIAGNOSIS — Z87898 Personal history of other specified conditions: Secondary | ICD-10-CM

## 2021-05-30 NOTE — Patient Instructions (Addendum)
Medication Instructions:  Your physician recommends that you continue on your current medications as directed.  We have removed Propranolol off of your medication list since you are doing so well on the Metoprolol   *If you need a refill on your cardiac medications before your next appointment, please call your pharmacy*   Lab Work: ON DAY OF ECHOCARDIOGRAM:  COME FASTING:  CMET, MAG, LIPID, CBC, & TSH  If you have labs (blood work) drawn today and your tests are completely normal, you will receive your results only by: MyChart Message (if you have MyChart) OR A paper copy in the mail If you have any lab test that is abnormal or we need to change your treatment, we will call you to review the results.   Testing/Procedures: Your physician has requested that you have an echocardiogram. Echocardiography is a painless test that uses sound waves to create images of your heart. It provides your doctor with information about the size and shape of your heart and how well your heart's chambers and valves are working. This procedure takes approximately one hour. There are no restrictions for this procedure.    Follow-Up: At [facility], you and your health needs are our priority.  As part of our continuing mission to provide you with exceptional heart care, we have created designated Provider Care Teams.  These Care Teams include your primary Cardiologist (physician) and Advanced Practice Providers (APPs -  Physician Assistants and Nurse Practitioners) who all work together to provide you with the care you need, when you need it.  We recommend signing up for the patient portal called "MyChart".  Sign up information is provided on this After Visit Summary.  MyChart is used to connect with patients for Virtual Visits (Telemedicine).  Patients are able to view lab/test results, encounter notes, upcoming appointments, etc.  Non-urgent messages can be sent to your provider as well.   To learn more about  what you can do with MyChart, go to [email or web address].    Your next appointment:   12 month(s)  The format for your next appointment:   In Person  Provider:   You may see Verne Carrow, MD or one of the following Advanced Practice Providers on your designated Care Team:   Ronie Spies, PA-C Jacolyn Reedy, PA-C   Other Instructions

## 2021-06-18 ENCOUNTER — Other Ambulatory Visit: Payer: Self-pay

## 2021-06-18 ENCOUNTER — Other Ambulatory Visit: Payer: Medicare Other | Admitting: *Deleted

## 2021-06-18 ENCOUNTER — Ambulatory Visit (HOSPITAL_COMMUNITY): Payer: Medicare Other | Attending: Cardiovascular Disease

## 2021-06-18 DIAGNOSIS — I341 Nonrheumatic mitral (valve) prolapse: Secondary | ICD-10-CM

## 2021-06-18 DIAGNOSIS — R002 Palpitations: Secondary | ICD-10-CM | POA: Diagnosis not present

## 2021-06-18 DIAGNOSIS — I77819 Aortic ectasia, unspecified site: Secondary | ICD-10-CM | POA: Diagnosis present

## 2021-06-18 DIAGNOSIS — E785 Hyperlipidemia, unspecified: Secondary | ICD-10-CM | POA: Insufficient documentation

## 2021-06-18 DIAGNOSIS — I428 Other cardiomyopathies: Secondary | ICD-10-CM

## 2021-06-18 LAB — CBC
Hematocrit: 39 % (ref 34.0–46.6)
Hemoglobin: 13.1 g/dL (ref 11.1–15.9)
MCH: 30.7 pg (ref 26.6–33.0)
MCHC: 33.6 g/dL (ref 31.5–35.7)
MCV: 91 fL (ref 79–97)
Platelets: 189 10*3/uL (ref 150–450)
RBC: 4.27 x10E6/uL (ref 3.77–5.28)
RDW: 12.4 % (ref 11.7–15.4)
WBC: 5 10*3/uL (ref 3.4–10.8)

## 2021-06-18 LAB — LIPID PANEL
Chol/HDL Ratio: 4.4 ratio (ref 0.0–4.4)
Cholesterol, Total: 241 mg/dL — ABNORMAL HIGH (ref 100–199)
HDL: 55 mg/dL (ref >39)
LDL Chol Calc (NIH): 168 mg/dL — ABNORMAL HIGH (ref 0–99)
Triglycerides: 102 mg/dL (ref 0–149)
VLDL Cholesterol Cal: 18 mg/dL (ref 5–40)

## 2021-06-18 LAB — COMPREHENSIVE METABOLIC PANEL
ALT: 15 IU/L (ref 0–32)
AST: 20 IU/L (ref 0–40)
Albumin/Globulin Ratio: 2.3 — ABNORMAL HIGH (ref 1.2–2.2)
Albumin: 4.4 g/dL (ref 3.7–4.7)
Alkaline Phosphatase: 118 IU/L (ref 44–121)
BUN/Creatinine Ratio: 27 (ref 12–28)
BUN: 17 mg/dL (ref 8–27)
Bilirubin Total: 0.4 mg/dL (ref 0.0–1.2)
CO2: 24 mmol/L (ref 20–29)
Calcium: 9.2 mg/dL (ref 8.7–10.3)
Chloride: 103 mmol/L (ref 96–106)
Creatinine, Ser: 0.64 mg/dL (ref 0.57–1.00)
Globulin, Total: 1.9 g/dL (ref 1.5–4.5)
Glucose: 92 mg/dL (ref 70–99)
Potassium: 4.3 mmol/L (ref 3.5–5.2)
Sodium: 142 mmol/L (ref 134–144)
Total Protein: 6.3 g/dL (ref 6.0–8.5)
eGFR: 92 mL/min/{1.73_m2} (ref >59)

## 2021-06-18 LAB — MAGNESIUM: Magnesium: 2.2 mg/dL (ref 1.6–2.3)

## 2021-06-18 LAB — ECHOCARDIOGRAM COMPLETE
Area-P 1/2: 1.66 cm2
S' Lateral: 3.1 cm

## 2021-06-18 LAB — TSH: TSH: 1.3 u[IU]/mL (ref 0.450–4.500)

## 2021-06-20 ENCOUNTER — Telehealth: Payer: Self-pay | Admitting: *Deleted

## 2021-06-20 DIAGNOSIS — E785 Hyperlipidemia, unspecified: Secondary | ICD-10-CM

## 2021-06-20 NOTE — Telephone Encounter (Signed)
-----   Message from Loa Socks, LPN sent at 46/21/9471 12:51 PM EDT -----  ----- Message ----- From: Laurann Montana, PA-C Sent: 06/19/2021   6:44 AM EDT To: [address] Triage  See also echo report. Let Pt know labs look good. Only abnormality is cholesterol. Remains too high. She is statin intolerant and also tries Repatha with issues. If pt willing would refer to Dr Rennis Golden with lipid clinic to discuss alternative therapies.

## 2021-06-20 NOTE — Progress Notes (Signed)
Pt has been made aware of normal result and verbalized understanding.  jw

## 2021-07-06 ENCOUNTER — Other Ambulatory Visit: Payer: Self-pay | Admitting: Physician Assistant

## 2021-07-06 ENCOUNTER — Other Ambulatory Visit: Payer: Self-pay | Admitting: Cardiovascular Disease

## 2021-08-15 ENCOUNTER — Telehealth: Payer: Self-pay | Admitting: Cardiovascular Disease

## 2021-08-15 NOTE — Telephone Encounter (Signed)
Pt c/o medication issue:  1. Name of Medication: ezetimibe zetia  2. How are you currently taking this medication (dosage and times per day)?   3. Are you having a reaction (difficulty breathing--STAT)? no  4. What is your medication issue? Patient was calling to see if she could take this medication for her cholesterol. Please advise

## 2021-08-15 NOTE — Telephone Encounter (Signed)
Left detailed message of note in chart from 04/05/2019 of intolerance to Zetia.  Not sure if she wanted to retry zetia or didn't remember that Zetia is one that she does not tolerate.  She has appointment with Dr. Rennis Golden in April for lipid management.  Supple, Megan E, RPH-CPP      9:44 AM Note Called pt to reschedule Repatha labs since she missed lab appt. She states she noticed myalgias and joint pain with her first dose of Repatha that was much worse than when she took statins, to the point where she had trouble walking. She is previously intolerant to rosuvastatin 5mg  once weekly, pravastatin 20mg  daily, Livalo, Zetia 10mg  daily, and Welchol 1875mg  BID. Unfortunately insurance will not cover Nexletol for primary prevention. Her PCP checked baseline lipids before starting her Repatha - will have these results faxed to our office for an updated baseline. Will keep pt in mind for any future lipid trials - she does not currently qualify for any with [facility].

## 2021-12-17 ENCOUNTER — Telehealth: Payer: Medicare Other | Admitting: Internal Medicine

## 2021-12-30 ENCOUNTER — Encounter: Payer: Self-pay | Admitting: Internal Medicine

## 2021-12-30 ENCOUNTER — Telehealth (INDEPENDENT_AMBULATORY_CARE_PROVIDER_SITE_OTHER): Payer: Medicare Other | Admitting: Internal Medicine

## 2021-12-30 VITALS — Wt 180.0 lb

## 2021-12-30 DIAGNOSIS — T466X5A Adverse effect of antihyperlipidemic and antiarteriosclerotic drugs, initial encounter: Secondary | ICD-10-CM | POA: Diagnosis not present

## 2021-12-30 DIAGNOSIS — I428 Other cardiomyopathies: Secondary | ICD-10-CM

## 2021-12-30 DIAGNOSIS — M791 Myalgia, unspecified site: Secondary | ICD-10-CM

## 2021-12-30 DIAGNOSIS — E785 Hyperlipidemia, unspecified: Secondary | ICD-10-CM | POA: Diagnosis not present

## 2021-12-30 NOTE — Patient Instructions (Addendum)
Medication Instructions:  ?NO CHANGES today  ? ?*If you need a refill on your cardiac medications before your next appointment, please call your pharmacy* ? ?Testing/Procedures: ?Dr. Debara Pickett has ordered a CT coronary calcium score.  ? ?Test locations:  ?[facility] ([address] [place], [place] [address]) ?[facility] ([address] [place], [place] [address])  ? ?This is $99 out of pocket. ? ? ?Coronary CalciumScan ?A coronary calcium scan is an imaging test used to look for deposits of calcium and other fatty materials (plaques) in the inner lining of the blood vessels of the heart (coronary arteries). These deposits of calcium and plaques can partly clog and narrow the coronary arteries without producing any symptoms or warning signs. This puts a person at risk for a heart attack. This test can detect these deposits before symptoms develop. ?Tell a health care provider about: ?Any allergies you have. ?All medicines you are taking, including vitamins, herbs, eye drops, creams, and over-the-counter medicines. ?Any problems you or family members have had with anesthetic medicines. ?Any blood disorders you have. ?Any surgeries you have had. ?Any medical conditions you have. ?Whether you are pregnant or may be pregnant. ?What are the risks? ?Generally, this is a safe procedure. However, problems may occur, including: ?Harm to a pregnant woman and her unborn baby. This test involves the use of radiation. Radiation exposure can be dangerous to a pregnant woman and her unborn baby. If you are pregnant, you generally should not have this procedure done. ?Slight increase in the risk of cancer. This is because of the radiation involved in the test. ?What happens before the procedure? ?No preparation is needed for this procedure. ?What happens during the procedure? ?You will undress and remove any jewelry around your neck or chest. ?You will put on a hospital gown. ?Sticky electrodes will be placed on  your chest. The electrodes will be connected to an electrocardiogram (ECG) machine to record a tracing of the electrical activity of your heart. ?A CT scanner will take pictures of your heart. During this time, you will be asked to lie still and hold your breath for 2-3 seconds while a picture of your heart is being taken. ?The procedure may vary among health care providers and hospitals. ?What happens after the procedure? ?You can get dressed. ?You can return to your normal activities. ?It is up to you to get the results of your test. Ask your health care provider, or the department that is doing the test, when your results will be ready. ?Summary ?A coronary calcium scan is an imaging test used to look for deposits of calcium and other fatty materials (plaques) in the inner lining of the blood vessels of the heart (coronary arteries). ?Generally, this is a safe procedure. Tell your health care provider if you are pregnant or may be pregnant. ?No preparation is needed for this procedure. ?A CT scanner will take pictures of your heart. ?You can return to your normal activities after the scan is done. ?This information is not intended to replace advice given to you by your health care provider. Make sure you discuss any questions you have with your health care provider. ?Document Released: 02/14/2008 Document Revised: 07/07/2016 Document Reviewed: 07/07/2016 ?Elsevier Interactive Patient Education ? 2017 Elsevier Inc. ? ? ? ?Follow-Up: ?At [facility], you and your health needs are our priority.  As part of our continuing mission to provide you with exceptional heart care, we have created designated Provider Care Teams.  These Care Teams  include your primary Cardiologist (physician) and Advanced Practice Providers (APPs -  Physician Assistants and Nurse Practitioners) who all work together to provide you with the care you need, when you need it. ? ?We recommend signing up for the patient portal called "MyChart".   Sign up information is provided on this After Visit Summary.  MyChart is used to connect with patients for Virtual Visits (Telemedicine).  Patients are able to view lab/test results, encounter notes, upcoming appointments, etc.  Non-urgent messages can be sent to your provider as well.   ?To learn more about what you can do with MyChart, go to [email or web address].   ? ?Your next appointment:   ?Based on Calcium Score results and medication/treatment plan ? ?

## 2021-12-30 NOTE — Addendum Note (Signed)
Addended by: Lindell Spar on: 12/30/2021 10:08 AM ? ? Modules accepted: Orders ? ?

## 2021-12-30 NOTE — Progress Notes (Signed)
? ?Virtual Visit via Telephone Note  ? ?This visit type was conducted due to national recommendations for restrictions regarding the COVID-19 Pandemic (e.g. social distancing) in an effort to limit this patient's exposure and mitigate transmission in our community.  Due to her co-morbid illnesses, this patient is at least at moderate risk for complications without adequate follow up.  This format is felt to be most appropriate for this patient at this time.  The patient did not have access to video technology/had technical difficulties with video requiring transitioning to audio format only (telephone).  All issues noted in this document were discussed and addressed.  No physical exam could be performed with this format.  Please refer to the patient's chart for her  consent to telehealth for [facility].  ? ?Date:  12/30/2021  ? ?ID:  SKYELER Chambers, DOB [birth year], MRN [id number] ?The patient was identified using 2 identifiers. ? ?Evaluation Performed:  New Patient Evaluation ? ?Patient Location:  ?[address] ?[place] [address] ? ?Provider location:   ?[address], [address] ?[place], [place] [address] ? ?PCP:  No primary care provider on file.  ?Cardiologist:  Verne Carrow, MD ?Electrophysiologist:  None  ? ?Chief Complaint:  Manage dyslipidemia ? ?History of Present Illness:   ? ?Sabrina Chambers is a 76 y.o. female who presents via audio/video conferencing for a telehealth visit today.  This is a pleasant 76 year old female kindly referred for evaluation management of dyslipidemia.  She has a history of nonischemic cardiomyopathy and has been intolerant to statins including pravastatin, Livalo, rosuvastatin 5 mg once weekly, Zetia 10 mg daily, WelChol and Repatha which caused significant myalgias.  Her last lipid profile showed total cholesterol 241, triglycerides 102, HDL 55 and LDL of 168.  She has no known coronary disease but did have a history of nonischemic cardiomyopathy.  There is family history of  coronary disease in her father who also had heart failure.  She is interested in entertaining other therapies.  She reports healthy diet and recent 20 pound weight loss which was intentional. ? ?The patient does not have symptoms concerning for COVID-19 infection (fever, chills, cough, or new SHORTNESS OF BREATH).  ? ? ?Prior CV studies:   ?The following studies were reviewed today: ? ?Chart reviewed, lipids reviewed ? ?PMHx:  ?Past Medical History:  ?Diagnosis Date  ? Dependent edema   ? Diastolic dysfunction   ? Hyperlipidemia   ? mixed  ? Mitral valve prolapse   ? Echocardiogram 01/2020: EF 56, no RWMA, Gr 1 DD, GLS -21.3, normal RVSF, mild bileaflet MVP (myxomatous), trivial MR, Ao root 37  ? NICM (nonischemic cardiomyopathy) (HCC)   ? a. nuclear stress test in 2009 with LVEF 42%, and echo in 2010 with EF 50%. b. subsequent echoes with normal LVEF.  ? Osteoarthritis   ? 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 Stress incontinence   ? ? ?Past Surgical History:  ?Procedure Laterality Date  ? ABDOMINAL HYSTERECTOMY    ? CARDIAC CATHETERIZATION    ? [facility] "CAD"  ? OVARIAN CYST REMOVAL    ? ? ?FAMHx:  ?Family History  ?Problem Relation Age of Onset  ? Coronary artery disease Other   ? Obesity Other   ? Hypertension Other   ? Heart failure Father   ? ? ?SOCHx:  ? reports that she has never smoked. She has never used smokeless tobacco. She reports that she does not drink alcohol and does not use drugs. ? ?ALLERGIES:  ?Allergies  ?  Allergen Reactions  ? Carbocaine [Mepivacaine Hcl]   ?  Anaphylaxis- pt stopped breathing  ? Epinephrine Other (See Comments)  ?  "extremem rapid heart beat"  ? Mepivacaine Anaphylaxis  ? Pravastatin   ?  Extreme pain in joints  ? Procaine Other (See Comments)  ?  Irregular heartbeat  ? Repatha [Evolocumab]   ?  Myalgias and joint pain  ? Rosuvastatin   ?  Myalgias on twice weekly crestor  ? Atorvastatin   ? Penicillins Other (See Comments)  ?  Weakness, vision problems, dyspnea, sweating  ? Sulfa Antibiotics  Other (See Comments)  ?  Topical cream applied - made her rash worse where she applied it.  ? ? ?MEDS: ? ?Current Meds  ?Medication Sig  ? acetaminophen (TYLENOL) 500 MG tablet Take 500 mg by mouth as needed for pain.  ?  ? ?ROS: ?Pertinent items noted in HPI and remainder of comprehensive ROS otherwise negative. ? ?Labs/Other Tests and Data Reviewed:   ? ?Recent Labs: ?06/18/2021: ALT 15; BUN 17; Creatinine, Ser 0.64; Hemoglobin 13.1; Magnesium 2.2; Platelets 189; Potassium 4.3; Sodium 142; TSH 1.300  ? ?Recent Lipid Panel ?Lab Results  ?Component Value Date/Time  ? CHOL 241 (H) 06/18/2021 09:24 AM  ? TRIG 102 06/18/2021 09:24 AM  ? HDL 55 06/18/2021 09:24 AM  ? CHOLHDL 4.4 06/18/2021 09:24 AM  ? CHOLHDL 4.5 06/24/2016 09:11 AM  ? LDLCALC 168 (H) 06/18/2021 09:24 AM  ? ? ?Wt Readings from Last 3 Encounters:  ?12/30/21 180 lb (81.6 kg)  ?05/30/21 191 lb 3.2 oz (86.7 kg)  ?05/30/20 202 lb 6.4 oz (91.8 kg)  ?  ? ?Exam:   ? ?Vital Signs:  Wt 180 lb (81.6 kg)   BMI 29.95 kg/m?   ? ?Exam not performed due to telephone visit ? ?ASSESSMENT & PLAN:   ? ?Mixed dyslipidemia ?Statin, zetia, Welchol and Repatha intolerant - myalgias ?History of NICM ? ?Ms. Beattie has a mixed dyslipidemia and has been intolerant to numerous statins, Zetia, WelChol and Repatha, all of which caused myalgias.  She has no known coronary disease but does have a history of nonischemic cardiomyopathy and family history of coronary disease.  I like to get a calcium score to further evaluate for this.  This could be helpful in potentially getting approval for other medication options if necessary.  She is agreeable to this approach.  We will try to schedule follow-up in the office since we had technical difficulty with video today. ? ?Thanks for the kind referral. ? ?COVID-19 Education: ?The signs and symptoms of COVID-19 were discussed with the patient and how to seek care for testing (follow up with PCP or arrange E-visit).  The importance of social  distancing was discussed today. ? ?Patient Risk:   ?After full review of this patients clinical status, I feel that they are at least moderate risk at this time. ? ?Time:   ?Today, I have spent 15 minutes with the patient with telehealth technology discussing dyslipidemia.   ? ? ?Medication Adjustments/Labs and Tests Ordered: ?Current medicines are reviewed at length with the patient today.  Concerns regarding medicines are outlined above.  ? ?Tests Ordered: ?No orders of the defined types were placed in this encounter. ? ? ?Medication Changes: ?No orders of the defined types were placed in this encounter. ? ? ?Disposition:  in 4 month(s) ? ?Chrystie Nose, MD, [facility], FACP  ?[place]  [facility]  ?Medical Director of the Advanced Lipid Disorders &  ?  Cardiovascular Risk Reduction Clinic ?Diplomate of the [employer] of Clinical Lipidology ?Attending Cardiologist  ?Direct Dial: [phone]  Fax: [phone]  ?Website:  [email or web address] ? ?Chrystie Nose, MD  ?12/30/2021 9:34 AM    ? ? ?

## 2022-02-17 ENCOUNTER — Ambulatory Visit
Admission: RE | Admit: 2022-02-17 | Discharge: 2022-02-17 | Disposition: A | Discharge status: Home / Self Care | Payer: Self-pay | Source: Ambulatory Visit | Attending: Internal Medicine | Admitting: Internal Medicine

## 2022-02-17 DIAGNOSIS — E785 Hyperlipidemia, unspecified: Secondary | ICD-10-CM

## 2022-02-17 DIAGNOSIS — M791 Myalgia, unspecified site: Secondary | ICD-10-CM

## 2022-02-17 DIAGNOSIS — I428 Other cardiomyopathies: Secondary | ICD-10-CM

## 2022-02-17 DIAGNOSIS — T466X5A Adverse effect of antihyperlipidemic and antiarteriosclerotic drugs, initial encounter: Secondary | ICD-10-CM

## 2022-06-29 ENCOUNTER — Other Ambulatory Visit: Payer: Self-pay | Admitting: Cardiovascular Disease

## 2022-08-19 NOTE — Progress Notes (Unsigned)
No chief complaint on file.  History of Present Illness: 76 yo female with history of palpitations, mitral valve prolapse and HLD, cardiomyopathy, chronic diastolic dysfunction who is here today for follow up. I saw her in May 2013 as a new patient for evaluation of palpitations and decreased LV systolic function. [facility] cardiac cath at [facility]. Echo in 2013 with normal LV systolic function with LVEF=60%. No mitral regurgitation. She was told in the past she had mitral valve prolapse. She had been followed by a cardiologist in [place], [place] and she feels that her legs still hurt because of the statin that he had prescribed. She was seen in October 2017 in our office as she was not happy with what she was told in that office. She described occasional chest pressure while at rest, rare palpitations and some LE edema.  Echo 06/23/16 with normal LV systolic function, grade 2 diastolic dysfunction, no significant valve disease. Nuclear stress test October 2017 with no ischemia. She has not tolerated statins or Repatha due to muscle aches. Her insurance does not cover Nexletol. She was seen by Tereso Newcomer, PA in June 2021 for palpitations, dyspnea, dizziness and near syncope.Echo June 2021 with  Normal LVEF, MVP with no MR. 30 day cardiac monitor showed PACs and PVCs. She was given propranolol to use as needed. Echo October 2022 with normal LV function, trivial MR. CT calcium score June 2023 of 17.2.   She is here today for follow up. The patient denies any chest pain, dyspnea, palpitations, lower extremity edema, orthopnea, PND, dizziness, near syncope or syncope.   Primary Care Physician: Darnelle Going, NP   Past Medical History:  Diagnosis Date   Dependent edema    Diastolic dysfunction    Hyperlipidemia    mixed   Mitral valve prolapse    Echocardiogram 01/2020: EF 56, no RWMA, Gr 1 DD, GLS -21.3, normal RVSF, mild bileaflet MVP (myxomatous), trivial MR, Ao root 37   NICM (nonischemic  cardiomyopathy) (HCC)    a. nuclear stress test in 2009 with LVEF 42%, and echo in 2010 with EF 50%. b. subsequent echoes with normal LVEF.   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     albuterol (VENTOLIN HFA) 108 (90 Base) MCG/ACT inhaler Inhale 1 puff into the lungs every 4 (four) hours as needed.     ALPRAZolam (XANAX) 0.5 MG tablet Take 0.5 mg by mouth 2 (two) times daily. (Patient not taking: Reported on 12/30/2021)     aspirin EC 81 MG tablet Take 81 mg by mouth at bedtime.     B Complex Vitamins (RA B-COMPLEX WITH B-12) TABS      Cholecalciferol 10 MCG (400 UNIT) CAPS      clindamycin (CLEOCIN) 300 MG capsule Take 300 mg by mouth 3 (three) times daily. PRIOR TO DENTAL SURGERY     diclofenac Sodium (VOLTAREN) 1 % GEL Apply 2 g topically 3 (three) times daily.     Iron-Vit C-Vit B12-Folic Acid 100-250-0.025-1 MG TABS Take 1 tablet by mouth daily.     metoprolol succinate (TOPROL-XL) 25 MG  24 hr tablet TAKE 1 TABLET BY MOUTH EVERY DAY 90 tablet 0   montelukast (SINGULAIR) 10 MG tablet Take 10 mg by mouth at bedtime.     sertraline (ZOLOFT) 25 MG tablet Take 25 mg by mouth daily.     triamcinolone cream (KENALOG) 0.1 % Apply 1 application topically 2 (two) times daily.     No current facility-administered medications for this visit.   Facility-Administered Medications Ordered in Other Visits  Medication Dose Route Frequency Provider Last Rate Last Admin   regadenoson (LEXISCAN) injection SOLN 0.4 mg  0.4 mg Intravenous Once Elouise Munroe, MD       technetium tetrofosmin (TC-MYOVIEW) injection [id number] millicurie  [id number] millicurie Intravenous Once PRN Elouise Munroe, MD       technetium tetrofosmin (TC-MYOVIEW) injection 0000000 millicurie  0000000 millicurie Intravenous Once PRN Elouise Munroe, MD        Allergies  Allergen Reactions   Carbocaine [Mepivacaine Hcl]     Anaphylaxis- pt stopped breathing   Epinephrine Other (See Comments)    "extremem rapid heart beat"   Mepivacaine Anaphylaxis   Pravastatin     Extreme pain in joints   Procaine Other (See Comments)    Irregular heartbeat   Repatha [Evolocumab]     Myalgias and joint pain   Rosuvastatin     Myalgias on twice weekly crestor   Atorvastatin    Penicillins Other (See Comments)    Weakness, vision problems, dyspnea, sweating   Sulfa Antibiotics Other (See Comments)    Topical cream applied - made her rash worse where she applied it.    Social History   Socioeconomic History   Marital status: Married    Spouse name: Not on file   Number of children: 3   Years of education: Not on file   Highest education level: Not on file  Occupational History   Occupation: Chief Strategy Officer  Tobacco Use   Smoking status: Never   Smokeless tobacco: Never  Vaping Use   Vaping Use: Never used  Substance and Sexual Activity   Alcohol use: No   Drug use: No   Sexual activity: Not on file  Other Topics Concern   Not on file  Social History Narrative   Not on file   Social Determinants of Health   Financial Resource Strain: Not on file  Food Insecurity: Not on file  Transportation Needs: Not on file  Physical Activity: Not on file  Stress: Not on file  Social Connections: Not on file  Intimate Partner Violence: Not on file    Family History  Problem Relation Age of Onset   Coronary artery disease Other    Obesity Other    Hypertension Other    Heart failure Father     Review of Systems:  As stated in the HPI and otherwise negative.   There were no vitals taken for this visit.  Physical Examination: General: Well developed, well nourished, NAD  HEENT: OP clear, mucus  membranes moist  SKIN: warm, dry. No rashes. Neuro: No focal deficits  Musculoskeletal: Muscle strength 5/5 all ext  Psychiatric: Mood and affect normal  Neck: No JVD, no carotid bruits, no thyromegaly, no lymphadenopathy.  Lungs:Clear bilaterally, no wheezes, rhonci, crackles Cardiovascular: Regular rate and rhythm. No murmurs, gallops or rubs. Abdomen:Soft. Bowel sounds present. Non-tender.  Extremities: No lower extremity edema. Pulses are 2 + in the bilateral DP/PT.  Echo October 2022:   1. Left ventricular ejection fraction, by estimation, is  55 to 60%. The  left ventricle has normal function. The left ventricle has no regional  wall motion abnormalities. Left ventricular diastolic parameters are  consistent with Grade I diastolic  dysfunction (impaired relaxation).   2. Right ventricular systolic function is normal. The right ventricular  size is normal. There is normal pulmonary artery systolic pressure. The  estimated right ventricular systolic pressure is 123456 mmHg.   3. The mitral valve is grossly normal. Trivial mitral valve  regurgitation. No evidence of mitral stenosis.   4. The aortic valve is tricuspid. Aortic valve regurgitation is not  visualized. No aortic stenosis is present.   5. Normal measurements of the aorta for age.   6. The inferior vena cava is normal in size with greater than 50%  respiratory variability, suggesting right atrial pressure of 3 mmHg.   EKG:  EKG is *** ordered today. The ekg ordered today demonstrates   Recent Labs: No results found for requested labs within last 365 days.   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 Continue ASA. She refuses to consider a statin or Repatha.   2. Non-ischemic cardiomyopathy: Normal LV function by echo October 2022  3. Hyperlipidemia: Statin intolerant. She also did not tolerate Repatha. Her lipids are followed in primary care. No changes today. *** lipid clinic referral.   4. PVCs/PACs: No recent palpitations. Continue Toprol.   Labs/ tests ordered today include:  No orders of the defined types were placed in this encounter.  Disposition:   F/U with me in 12 months  Signed, Lauree Chandler, MD 08/19/2022 9:50 AM    [facility] [place], [place], [place]  [address] Phone: [phone]; Fax: [phone]

## 2022-08-20 ENCOUNTER — Ambulatory Visit: Payer: Medicare Other | Attending: Cardiovascular Disease | Admitting: Cardiovascular Disease

## 2022-08-20 ENCOUNTER — Encounter: Payer: Self-pay | Admitting: Cardiovascular Disease

## 2022-08-20 VITALS — BP 116/80 | HR 75 | Ht 65.0 in | Wt 184.6 lb

## 2022-08-20 DIAGNOSIS — I493 Ventricular premature depolarization: Secondary | ICD-10-CM | POA: Diagnosis not present

## 2022-08-20 DIAGNOSIS — E785 Hyperlipidemia, unspecified: Secondary | ICD-10-CM | POA: Insufficient documentation

## 2022-08-20 DIAGNOSIS — I428 Other cardiomyopathies: Secondary | ICD-10-CM | POA: Insufficient documentation

## 2022-08-20 DIAGNOSIS — I2510 Atherosclerotic heart disease of native coronary artery without angina pectoris: Secondary | ICD-10-CM | POA: Insufficient documentation

## 2022-08-20 NOTE — Patient Instructions (Signed)
Medication Instructions:  Your physician recommends that you continue on your current medications as directed. Please refer to the Current Medication list given to you today.  *If you need a refill on your cardiac medications before your next appointment, please call your pharmacy*   Lab Work: None. If you have labs (blood work) drawn today and your tests are completely normal, you will receive your results only by: MyChart Message (if you have MyChart) OR A paper copy in the mail If you have any lab test that is abnormal or we need to change your treatment, we will call you to review the results.   Testing/Procedures: None.   Follow-Up:  Your next appointment:   1 year(s)  The format for your next appointment:   In Person  Provider:   Christopher McAlhany, MD      Important Information About Sugar       

## 2022-10-03 ENCOUNTER — Other Ambulatory Visit: Payer: Self-pay | Admitting: Cardiovascular Disease

## 2023-08-16 NOTE — Progress Notes (Unsigned)
Cardiology Office Note    Patient Name: Sabrina Chambers Date of Encounter: 08/16/2023  Primary Care Provider:  Darnelle Going, NP Primary Cardiologist:  Verne Carrow, MD Primary Electrophysiologist: None   Past Medical History    Past Medical History:  Diagnosis Date   Dependent edema    Diastolic dysfunction    Hyperlipidemia    mixed   Mitral valve prolapse    Echocardiogram 01/2020: EF 56, no RWMA, Gr 1 DD, GLS -21.3, normal RVSF, mild bileaflet MVP (myxomatous), trivial MR, Ao root 37   NICM (nonischemic cardiomyopathy) (HCC)    a. nuclear stress test in 2009 with LVEF 42%, and echo in 2010 with EF 50%. b. subsequent echoes with normal LVEF.   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 female with a PMH of NICM, HLD (statin intolerant), MVP, diastolic dysfunction, palpitations who presents today for 1 year follow-up.  Sabrina Chambers was seen initially by Dr. Clifton James back in 2013 for complaint of palpitations.  She had previously underwent LHC at [facility] in 1998 that was normal.  She completed a Lexiscan Myoview in 2017 that was also normal.  She underwent 2D echo that showed normal LV function with grade 2 DD and no significant valve disease.  She was seen in June 2021 for complaint of palpitations and was started on propranolol and wore a 30-day event monitor that showed PACs and PVCs.  She was last seen by Dr. Clifton James on 08/20/2022 for follow-up visit.  During her visit she was feeling well overall and denied any chest pain.  Her most recent 2D echo was completed 06/2021 showing normal LV function and patient was previously seen in lipid clinic by Dr. Rennis Golden with no options available for managing her cholesterol.  She was advised to continue Toprol XL for PVCs and PACs.   During today's visit the patient reports*** .  Patient denies chest pain, palpitations, dyspnea, PND, orthopnea, nausea, vomiting,  dizziness, syncope, edema, weight gain, or early satiety.  ***Notes: -Last ischemic evaluation: -Last echo: -Interim ED visits: Review of Systems  Please see the history of present illness.    All other systems reviewed and are otherwise negative except as noted above.  Physical Exam    Wt Readings from Last 3 Encounters:  08/20/22 184 lb 9.6 oz (83.7 kg)  12/30/21 180 lb (81.6 kg)  05/30/21 191 lb 3.2 oz (86.7 kg)   RU:EAVWU were no vitals filed for this visit.,There is no height or weight on file to calculate BMI. GEN: Well nourished, well developed in no acute distress Neck: No JVD; No carotid bruits Pulmonary: Clear to auscultation without rales, wheezing or rhonchi  Cardiovascular: Normal rate. Regular rhythm. Normal S1. Normal S2.   Murmurs: There is no murmur.  ABDOMEN: Soft, non-tender, non-distended EXTREMITIES:  No edema; No deformity   EKG/LABS/ Recent Cardiac Studies   ECG personally reviewed by me today - ***  Risk Assessment/Calculations:   {Does this patient have ATRIAL FIBRILLATION?:[phone]}      Lab Results  Component Value Date   WBC 5.0 06/18/2021   HGB 13.1 06/18/2021   HCT 39.0 06/18/2021   MCV 91 06/18/2021   PLT 189 06/18/2021   Lab Results  Component Value Date   CREATININE 0.64 06/18/2021   BUN 17 06/18/2021   NA 142 06/18/2021   K 4.3 06/18/2021   CL 103 06/18/2021  CO2 24 06/18/2021   Lab Results  Component Value Date   CHOL 241 (H) 06/18/2021   HDL 55 06/18/2021   LDLCALC 168 (H) 06/18/2021   TRIG 102 06/18/2021   CHOLHDL 4.4 06/18/2021    No results found for: "HGBA1C" Assessment & Plan    1.  History of NICM:  2.  Nonobstructive CAD:  3.  History of PVCs/PACs:  4.  Statin intolerant:      Disposition: Follow-up with Verne Carrow, MD or APP in *** months {Are you ordering a CV Procedure (e.g. stress test, cath, DCCV, TEE, etc)?   Press F2        :[id number]}   Signed, Napoleon Form, Leodis Rains,  NP 08/16/2023, 4:16 PM [facility]

## 2023-08-17 ENCOUNTER — Ambulatory Visit: Payer: Medicare Other | Admitting: Nurse Practitioner

## 2023-09-21 ENCOUNTER — Ambulatory Visit: Payer: Medicare Other | Admitting: Cardiovascular Disease

## 2023-09-30 ENCOUNTER — Other Ambulatory Visit: Payer: Self-pay | Admitting: Cardiovascular Disease

## 2023-11-13 ENCOUNTER — Ambulatory Visit: Admitting: Cardiovascular Disease

## 2023-11-16 ENCOUNTER — Encounter: Payer: Self-pay | Admitting: Cardiovascular Disease

## 2023-11-16 ENCOUNTER — Ambulatory Visit: Attending: Cardiovascular Disease | Admitting: Cardiovascular Disease

## 2023-11-16 VITALS — BP 122/78 | HR 78 | Ht 65.0 in | Wt 173.6 lb

## 2023-11-16 DIAGNOSIS — E785 Hyperlipidemia, unspecified: Secondary | ICD-10-CM | POA: Diagnosis not present

## 2023-11-16 DIAGNOSIS — I2510 Atherosclerotic heart disease of native coronary artery without angina pectoris: Secondary | ICD-10-CM | POA: Diagnosis not present

## 2023-11-16 DIAGNOSIS — I493 Ventricular premature depolarization: Secondary | ICD-10-CM | POA: Insufficient documentation

## 2023-11-16 DIAGNOSIS — I428 Other cardiomyopathies: Secondary | ICD-10-CM | POA: Insufficient documentation

## 2023-11-16 NOTE — Patient Instructions (Signed)

## 2023-11-16 NOTE — Progress Notes (Signed)
 Chief Complaint  Patient presents with   Follow-up    CAD, mitral valve prolapse   History of Present Illness: 78 yo female with history of CAD (abnormal coronary calcium score) palpitations, mitral valve prolapse without mitral regurgitation, PVCs, PACs, HLD and chronic diastolic dysfunction who is here today for follow up. Normal cardiac [facility] at [facility]. Echo in 2013 with normal LV systolic function with LVEF=60%. No mitral regurgitation. She had been followed by a cardiologist in [place], [place] and she feels that her legs still hurt because of the statin that he had prescribed. Nuclear stress test October 2017 with no ischemia. She has not tolerated statins or Repatha due to muscle aches. She was seen by Tereso Newcomer, PA in June 2021 for palpitations, dyspnea, dizziness and near syncope. Echo June 2021 with  Normal LV systolic function, MVP with no MR. 30 day cardiac monitor showed sinus with PACs and PVCs. She was given propranolol to use as needed. Echo October 2022 with normal LV function, trivial MR. CT calcium score June 2023 of 17.2.   She is here today for follow up. The patient denies any chest pain, dyspnea, palpitations, lower extremity edema, orthopnea, PND, dizziness, near syncope or syncope. Recent Covid infection. She is recovering well.   Primary Care Physician: Darnelle Going, NP   Past Medical History:  Diagnosis Date   Dependent edema    Diastolic dysfunction    Hyperlipidemia    mixed   Mitral valve prolapse    Echocardiogram 01/2020: EF 56, no RWMA, Gr 1 DD, GLS -21.3, normal RVSF, mild bileaflet MVP (myxomatous), trivial MR, Ao root 37   NICM (nonischemic cardiomyopathy) (HCC)    a. nuclear stress test in 2009 with LVEF 42%, and echo in 2010 with EF 50%. b. subsequent echoes with normal LVEF.   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     Ascorbic Acid (VITAMIN C) 100 MG CHEW Vitamin C  WICKLINE TARA     aspirin EC 81 MG tablet Take 81 mg by mouth at bedtime.     B Complex Vitamins (RA B-COMPLEX WITH B-12) TABS      Cholecalciferol (VITAMIN D3) 250 MCG (10000 UT) capsule Take 10,000 Units by mouth daily.     clindamycin (CLEOCIN) 300 MG capsule Take 300 mg by mouth 3 (three) times daily. PRIOR TO DENTAL SURGERY     diclofenac Sodium (VOLTAREN) 1 % GEL Apply 2 g topically 3 (three) times daily.     hydrocortisone 2.5 % cream APPLY AS DIRECTED TO AFFECTED AREA TWICE A DAY UNTIL CLEAR.     Iron Combinations (CHROMAGEN) capsule Take 1 capsule by mouth daily.     loratadine (CLARITIN) 10 MG tablet Take by mouth daily.     magnesium oxide (MAG-OX) 400 MG tablet Take by mouth.     metoprolol succinate (TOPROL-XL) 25 MG 24 hr tablet TAKE 1 TABLET BY MOUTH EVERY DAY 90 tablet 1   montelukast (SINGULAIR) 10 MG tablet Take 10 mg by mouth at bedtime.     Multiple Vitamins-Minerals (CENTRUM SILVER ULTRA WOMENS) TABS  Take by mouth.     Polyethyl Glycol-Propyl Glycol (SYSTANE) 0.4-0.3 % GEL ophthalmic gel Place 1 Application into both eyes.     Probiotic, Lactobacillus, CAPS Take by mouth.     Psyllium (METAMUCIL) 0.36 g CAPS      senna (SENOKOT) 8.6 MG tablet Take by mouth as needed for constipation.     Vitamin A 3 MG (10000 UT) TABS Vitamin A  WICKLINE TARA     No current facility-administered medications for this visit.   Facility-Administered Medications Ordered in Other Visits  Medication Dose Route Frequency Provider Last Rate Last Admin   regadenoson (LEXISCAN) injection SOLN 0.4 mg  0.4 mg Intravenous Once Parke Poisson, MD       technetium tetrofosmin (TC-MYOVIEW) injection 10.4 millicurie  10.4 millicurie Intravenous Once PRN  Parke Poisson, MD       technetium tetrofosmin (TC-MYOVIEW) injection 31.5 millicurie  31.5 millicurie Intravenous Once PRN Parke Poisson, MD        Allergies  Allergen Reactions   Carbocaine [Mepivacaine Hcl]     Anaphylaxis- pt stopped breathing   Epinephrine Other (See Comments)    "extremem rapid heart beat"   Mepivacaine Anaphylaxis   Pravastatin     Extreme pain in joints   Procaine Other (See Comments) and Anaphylaxis    Irregular heartbeat  Other Reaction(s): Unknown  procaine hydrochloride   Repatha [Evolocumab]     Myalgias and joint pain   Rosuvastatin     Myalgias on twice weekly crestor   Atorvastatin    Penicillins Other (See Comments)    Weakness, vision problems, dyspnea, sweating   Sulfa Antibiotics Other (See Comments)    Topical cream applied - made her rash worse where she applied it.    Social History   Socioeconomic History   Marital status: Married    Spouse name: Not on file   Number of children: 3   Years of education: Not on file   Highest education level: Not on file  Occupational History   Occupation: Surveyor, minerals  Tobacco Use   Smoking status: Never   Smokeless tobacco: Never  Vaping Use   Vaping status: Never Used  Substance and Sexual Activity   Alcohol use: No   Drug use: No   Sexual activity: Not on file  Other Topics Concern   Not on file  Social History Narrative   Not on file   Social Drivers of Health   Financial Resource Strain: Not on file  Food Insecurity: Not on file  Transportation Needs: Not on file  Physical Activity: Not on file  Stress: Not on file  Social Connections: Not on file  Intimate Partner Violence: Not on file    Family History  Problem Relation Age of Onset   Coronary artery disease Other    Obesity Other    Hypertension Other    Heart failure Father     Review of Systems:  As stated in the HPI and otherwise negative.   BP 122/78   Pulse 78   Ht 5\' 5"  (1.651 m)   Wt 78.7 kg    SpO2 96%   BMI 28.89 kg/m   Physical Examination: General: Well developed, well nourished, NAD  HEENT: OP clear, mucus membranes moist  SKIN: warm, dry. No rashes. Neuro: No focal deficits  Musculoskeletal: Muscle strength 5/5 all ext  Psychiatric: Mood and affect normal  Neck: No JVD, no carotid bruits, no thyromegaly, no lymphadenopathy.  Lungs:Clear bilaterally, no wheezes, rhonci,  crackles Cardiovascular: Regular rate and rhythm. No murmurs, gallops or rubs. Abdomen:Soft. Bowel sounds present. Non-tender.  Extremities: No lower extremity edema. Pulses are 2 + in the bilateral DP/PT.  EKG:  EKG is ordered today. The ekg ordered today demonstrates  EKG Interpretation Date/Time:  Monday November 16 2023 11:24:21 EDT Ventricular Rate:  83 PR Interval:  192 QRS Duration:  86 QT Interval:  380 QTC Calculation: 446 R Axis:   -38  Text Interpretation: Normal sinus rhythm Confirmed by Verne Carrow [id number]) on 11/16/2023 11:26:19 AM    Recent Labs: No results found for requested labs within last 365 days.   Lipid Panel    Component Value Date/Time   CHOL 241 (H) 06/18/2021 0924   TRIG 102 06/18/2021 0924   HDL 55 06/18/2021 0924   CHOLHDL 4.4 06/18/2021 0924   CHOLHDL 4.5 06/24/2016 0911   VLDL 18 06/24/2016 0911   LDLCALC 168 (H) 06/18/2021 0924     Wt Readings from Last 3 Encounters:  11/16/23 78.7 kg  08/20/22 83.7 kg  12/30/21 81.6 kg    Assessment and Plan:   1. CAD without angina: Calcium score of 17 in June 2023. No chest pain. Continue ASA and Toprol. She has not tolerated statins or Repatha.   2. Non-ischemic cardiomyopathy: Normal LV function by echo October 2022. Continue beta blocker.   3. Hyperlipidemia: Statin intolerant. She also did not tolerate Repatha. She has been seen in lipid clinic by Dr. Rennis Golden. No good options for medical therapy.   4. PVCs/PACs: No recent palpitations. Continue Toprol.   Labs/ tests ordered today include:   Orders  Placed This Encounter  Procedures   EKG 12-Lead   Disposition:   F/U with me in 12 months  Signed, Verne Carrow, MD 11/16/2023 11:38 AM    [facility] [address] [place], [place], [place]  [address] Phone: [phone]; Fax: [phone]

## 2023-11-26 IMAGING — CT CT CARDIAC CORONARY ARTERY CALCIUM SCORE
3 series · 11 of 20 positions shown, 13 images · non-contrast
Comparison: None
COMPARISON: None

Addendum:
CLINICAL DATA: Cardiovascular Disease Risk stratification

EXAM:
Coronary Calcium Score
TECHNIQUE: A gated, non-contrast computed tomography scan of the heart was
performed using 3mm slice thickness. Axial images were analyzed on a
dedicated workstation. Calcium scoring of the coronary arteries was
performed using the Agatston method.

[Series 2: cascseq 2.0 sa36 (id) (id) · axial · 0.39mm/px · z∈[-459,-369]mm · 4 of 76 slices shown]
[im 16/76  vessel]
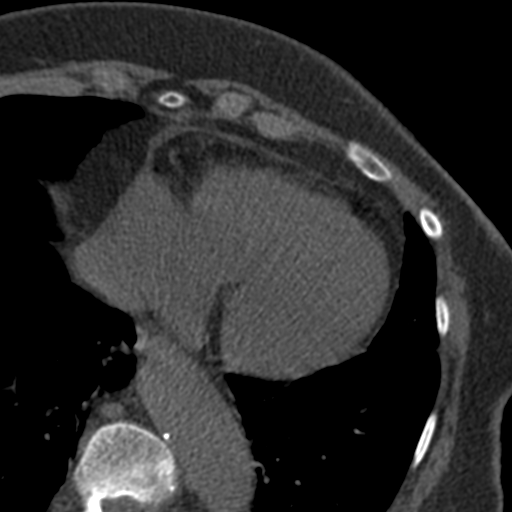
[im 31/76  vessel]
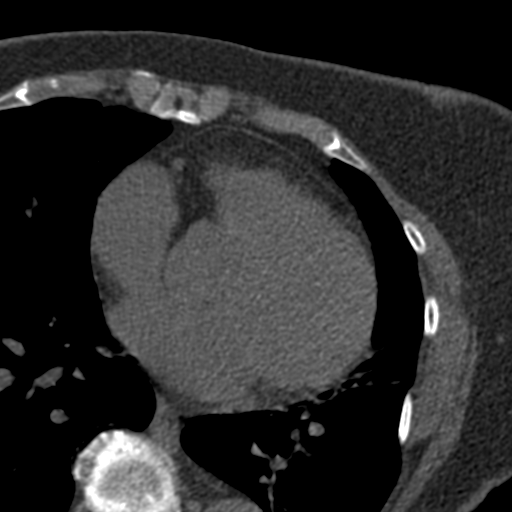
[im 46/76  vessel]
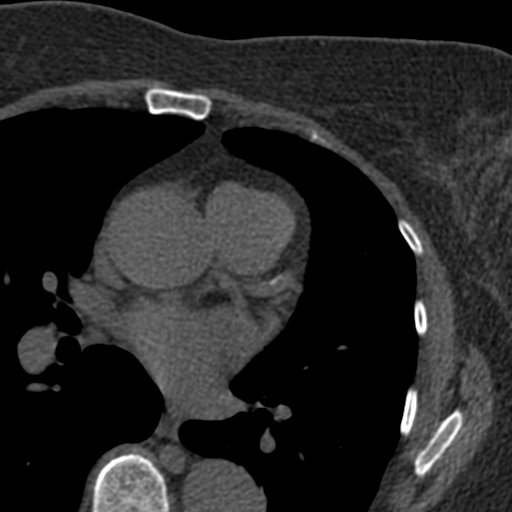
[im 61/76  vessel]
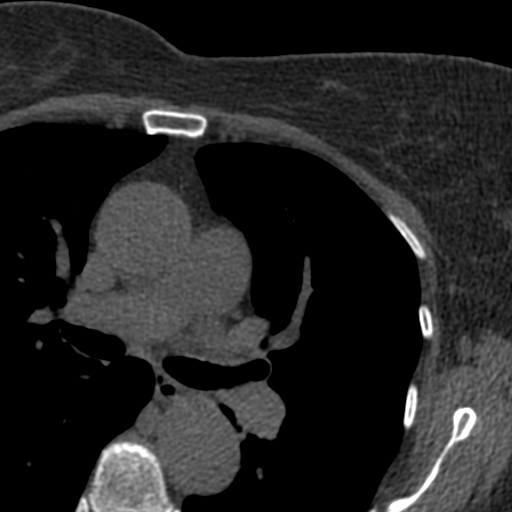

[Series 3: cascseq 2.0 bf37 st · axial · 0.66mm/px · z∈[-465,-365]mm · 5 of 76 slices shown, 7 images]
[im 13/76  vessel]
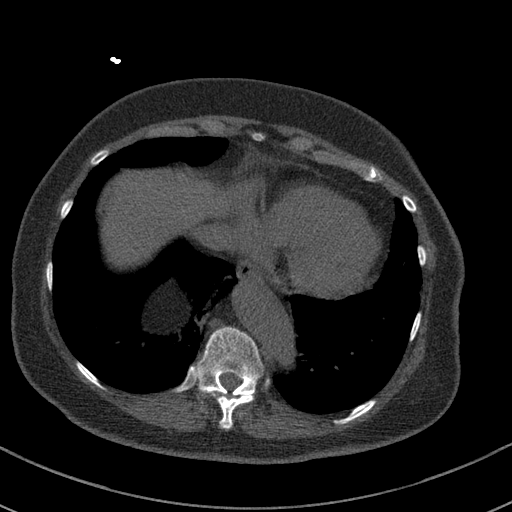
[im 13/76  lung]
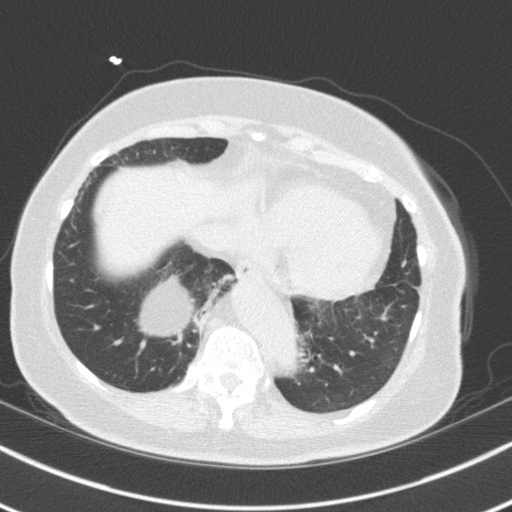
[im 26/76  vessel]
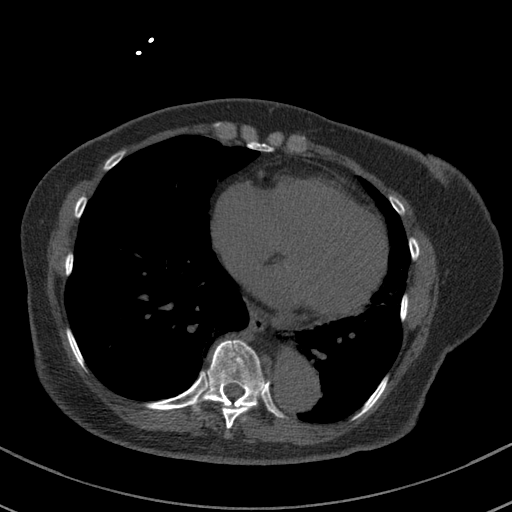
[im 38/76  vessel]
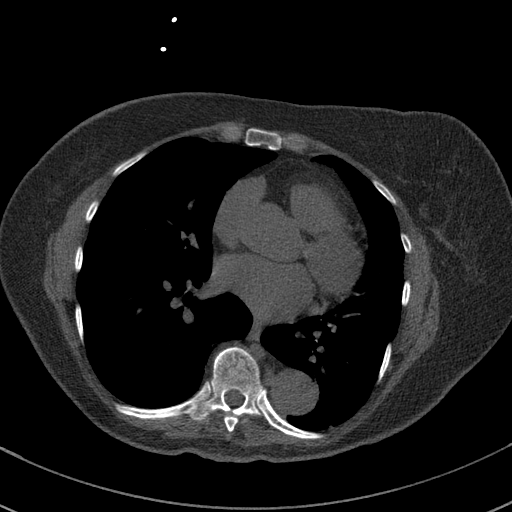
[im 51/76  vessel]
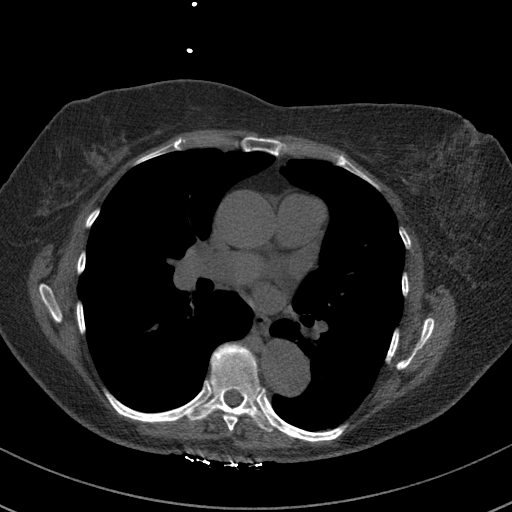
[im 63/76  vessel]
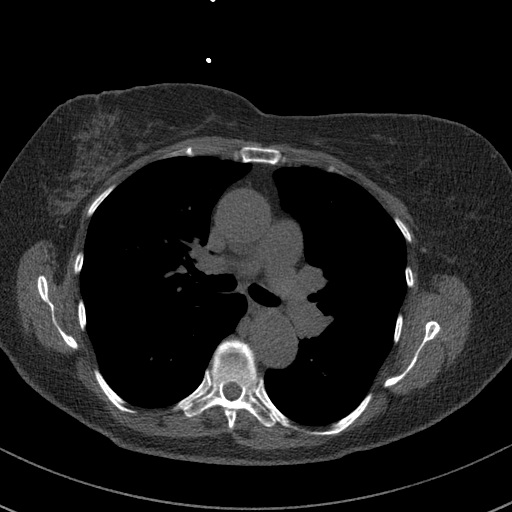
[im 63/76  lung]
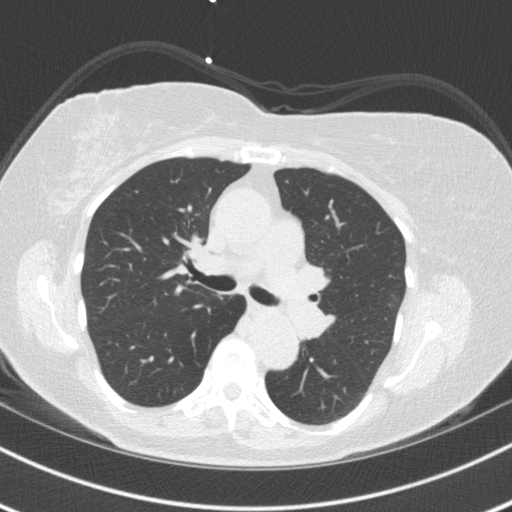

[Series 4: cascseq 2.0 br59 lung · axial · 0.66mm/px · z∈[-465,-439]mm · 2 of 76 slices shown]
[im 13/76  lung]
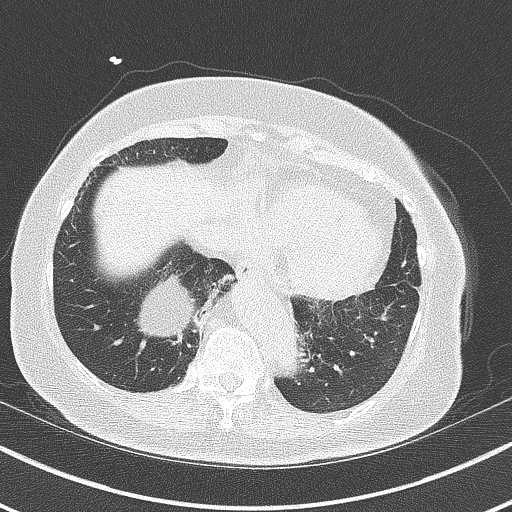
[im 26/76  lung]
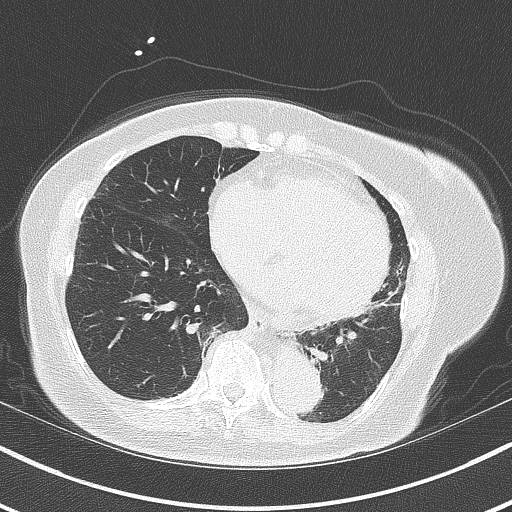

[11 of 20 positions shown; findings below may reference images not displayed]

FINDINGS: Coronary arteries: Normal origins.

Coronary Calcium Score:

Left main: 0

Left anterior descending artery:

Left circumflex artery:

Right coronary artery: 0

Total:

Percentile: 37th

Pericardium: Normal.

Aorta: Normal caliber.

Non-cardiac: See separate report from [REDACTED].
IMPRESSION: Coronary calcium score of 17.2. This was 37th percentile for age-,
race-, and sex-matched controls.



If CAC=0, it is reasonable to withhold statin therapy and reassess
in 5 to 10 years, as long as higher risk conditions are absent
(diabetes mellitus, family history of premature CHD in first degree
relatives (males <55 years; females <65 years), cigarette smoking,
or LDL >=190 mg/dL).

If CAC is 1 to 99, it is reasonable to initiate statin therapy for
patients >=55 years of age.

If CAC is >=100 or >=75th percentile, it is reasonable to initiate
statin therapy at any age.

Cardiology referral should be considered for patients with CAC
scores >=400 or >=75th percentile.

*2250 AHA/ACC/AACVPR/AAPA/ABC/ARCIUCH/SWAN/AGBETSI/Ibrahim Husaam/FRIEDLAND/ANH/ROMAN JR
Guideline on the Management of Blood Cholesterol: A Report of the
American College of Cardiology/American Heart Association Task Force
on Clinical Practice Guidelines. J Am Coll Cardiol.
4425;73(24):2908-2197.

EXAM:
OVER-READ INTERPRETATION  CT CHEST

The following report is an over-read performed by radiologist Dr.
Mfanfikile Misra [REDACTED] on 02/18/2022. This over-read
does not include interpretation of cardiac or coronary anatomy or
pathology. The coronary calcium score interpretation by the
cardiologist is attached.
FINDINGS: Ascending aorta upper normal caliber.

Tortuosity of descending thoracic aorta with mild atherosclerotic
calcification.

No pericardial effusion.

Esophagus unremarkable.

No thoracic adenopathy.

Calcified granuloma RIGHT upper lobe.

Minimal bibasilar atelectasis.

No infiltrate, pleural effusion, or pneumothorax.

Visualized upper abdomen unremarkable.

Posterior RIGHT diaphragmatic defect Bochdalek type containing fat.

No osseous or chest wall abnormalities.
IMPRESSION: No significant abnormalities.

Minimal bibasilar atelectasis and note of a posterior RIGHT
diaphragmatic Bochdalek type defect.

ADDENDUM:
As above.

*** End of Addendum ***
Addendum:
FINDINGS: Coronary arteries: Normal origins.

Coronary Calcium Score:

Left main: 0

Left anterior descending artery:

Left circumflex artery:

Right coronary artery: 0

Total:

Percentile: 37th

Pericardium: Normal.

Aorta: Normal caliber.

Non-cardiac: See separate report from [REDACTED].
IMPRESSION: Coronary calcium score of 17.2. This was 37th percentile for age-,
race-, and sex-matched controls.



If CAC=0, it is reasonable to withhold statin therapy and reassess
in 5 to 10 years, as long as higher risk conditions are absent
(diabetes mellitus, family history of premature CHD in first degree
relatives (males <55 years; females <65 years), cigarette smoking,
or LDL >=190 mg/dL).

If CAC is 1 to 99, it is reasonable to initiate statin therapy for
patients >=55 years of age.

If CAC is >=100 or >=75th percentile, it is reasonable to initiate
statin therapy at any age.

Cardiology referral should be considered for patients with CAC
scores >=400 or >=75th percentile.

*2250 AHA/ACC/AACVPR/AAPA/ABC/ARCIUCH/SWAN/AGBETSI/Ibrahim Husaam/FRIEDLAND/ANH/ROMAN JR
Guideline on the Management of Blood Cholesterol: A Report of the
American College of Cardiology/American Heart Association Task Force
on Clinical Practice Guidelines. J Am Coll Cardiol.
4425;73(24):2908-2197.

EXAM:
OVER-READ INTERPRETATION  CT CHEST

The following report is an over-read performed by radiologist Dr.
Mfanfikile Misra [REDACTED] on 02/18/2022. This over-read
does not include interpretation of cardiac or coronary anatomy or
pathology. The coronary calcium score interpretation by the
cardiologist is attached.
FINDINGS: Ascending aorta upper normal caliber.

Tortuosity of descending thoracic aorta with mild atherosclerotic
calcification.

No pericardial effusion.

Esophagus unremarkable.

No thoracic adenopathy.

Calcified granuloma RIGHT upper lobe.

Minimal bibasilar atelectasis.

No infiltrate, pleural effusion, or pneumothorax.

Visualized upper abdomen unremarkable.

Posterior RIGHT diaphragmatic defect Bochdalek type containing fat.

No osseous or chest wall abnormalities.
IMPRESSION: No significant abnormalities.

Minimal bibasilar atelectasis and note of a posterior RIGHT
diaphragmatic Bochdalek type defect.

*** End of Addendum ***
FINDINGS: Coronary arteries: Normal origins.

Coronary Calcium Score:

Left main: 0

Left anterior descending artery:

Left circumflex artery:

Right coronary artery: 0

Total:

Percentile: 37th

Pericardium: Normal.

Aorta: Normal caliber.

Non-cardiac: See separate report from [REDACTED].
IMPRESSION: Coronary calcium score of 17.2. This was 37th percentile for age-,
race-, and sex-matched controls.



If CAC=0, it is reasonable to withhold statin therapy and reassess
in 5 to 10 years, as long as higher risk conditions are absent
(diabetes mellitus, family history of premature CHD in first degree
relatives (males <55 years; females <65 years), cigarette smoking,
or LDL >=190 mg/dL).

If CAC is 1 to 99, it is reasonable to initiate statin therapy for
patients >=55 years of age.

If CAC is >=100 or >=75th percentile, it is reasonable to initiate
statin therapy at any age.

Cardiology referral should be considered for patients with CAC
scores >=400 or >=75th percentile.

*2250 AHA/ACC/AACVPR/AAPA/ABC/ARCIUCH/SWAN/AGBETSI/Ibrahim Husaam/F[place]ED[place]/ANH/ROMAN JR
Guideline on the Management of Blood Cholesterol: A Report of the
American College of Cardiology/American Heart Association Task Force
on Clinical Practice Guidelines. J Am Coll Cardiol.
4425;73(24):2908-2197.

## 2024-01-11 ENCOUNTER — Ambulatory Visit: Payer: Medicare Other | Admitting: Cardiovascular Disease

## 2024-04-18 ENCOUNTER — Other Ambulatory Visit: Payer: Self-pay | Admitting: Cardiovascular Disease

## 2024-04-18 MED ORDER — METOPROLOL SUCCINATE ER 25 MG PO TB24
25.0000 mg | ORAL_TABLET | Freq: Every day | ORAL | 3 refills | Status: AC
Start: 2024-04-18 — End: ?

## 2025-01-10 ENCOUNTER — Ambulatory Visit: Admitting: Cardiovascular Disease
